# Patient Record
Sex: Female | Born: 1990 | Race: Black or African American | Hispanic: Yes | Marital: Single | Smoking: Former smoker
Health system: Southern US, Community
[De-identification: ages and names within clinical notes are randomized; demographics above are authoritative.]

## PROBLEM LIST (undated history)

## (undated) ENCOUNTER — Emergency Department (HOSPITAL_COMMUNITY): Payer: Medicaid Other

## (undated) DIAGNOSIS — J45909 Unspecified asthma, uncomplicated: Secondary | ICD-10-CM

---

## 2016-08-04 ENCOUNTER — Emergency Department (HOSPITAL_COMMUNITY)
Admission: EM | Admit: 2016-08-04 | Discharge: 2016-08-04 | Disposition: A | Discharge status: Home / Self Care | Payer: Medicaid - Out of State | Attending: Emergency Medicine | Admitting: Emergency Medicine

## 2016-08-04 ENCOUNTER — Encounter (HOSPITAL_COMMUNITY): Payer: Self-pay | Admitting: Emergency Medicine

## 2016-08-04 DIAGNOSIS — J45909 Unspecified asthma, uncomplicated: Secondary | ICD-10-CM | POA: Insufficient documentation

## 2016-08-04 DIAGNOSIS — O26892 Other specified pregnancy related conditions, second trimester: Secondary | ICD-10-CM | POA: Insufficient documentation

## 2016-08-04 DIAGNOSIS — H9201 Otalgia, right ear: Secondary | ICD-10-CM

## 2016-08-04 DIAGNOSIS — H6501 Acute serous otitis media, right ear: Secondary | ICD-10-CM | POA: Insufficient documentation

## 2016-08-04 DIAGNOSIS — Z3A23 23 weeks gestation of pregnancy: Secondary | ICD-10-CM | POA: Insufficient documentation

## 2016-08-04 HISTORY — DX: Unspecified asthma, uncomplicated: J45.909

## 2016-08-04 MED ORDER — AMOXICILLIN 500 MG PO CAPS: 500.0000 mg | ORAL_CAPSULE | Freq: Three times a day (TID) | ORAL | 0 refills | Status: DC

## 2016-08-04 NOTE — Discharge Instructions (Signed)
Take tylenol as needed for pain.  Call [facility] to schedule your prenatal care.  If you have problems go to their emergency department. It is called Maternity Admissions.
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
## 2016-08-04 NOTE — ED Provider Notes (Signed)
[facility]-EMERGENCY DEPT Provider Note   CSN: [id number] Arrival date & time: 08/04/16  1720   By signing my name below, I, Clovis Pu, attest that this documentation has been prepared under the direction and in the presence of  Kerrie Buffalo, NP. Electronically Signed: Clovis Pu, ED Scribe. 08/04/16. 6:16 PM.   History   Chief Complaint Chief Complaint  Patient presents with  . Otalgia    HPI Comments:  Yvonne Jenkins is a 26 y.o. female, [redacted]w[redacted]d, who presents to the Emergency Department complaining of ongoing, moderate right ear pain x 2 weeks. She also reports right sided dental pain. Her pain is worse when eating. Pt has been taking Tylenol with temporary relief. Pt denies abdominal pain or any other associated symptoms. Pt recently moved from [place] and not followed by OBGYN in [place], [place]. No other complaints noted at this time.   The history is provided by the patient. No language interpreter was used.  Otalgia  This is a new problem. The current episode started more than 1 week ago. There is pain in the right ear. The problem occurs constantly. The problem has not changed since onset.There has been no fever. The pain is moderate. Pertinent negatives include no ear discharge, no headaches, no sore throat, no abdominal pain, no vomiting and no rash.    Past Medical History:  Diagnosis Date  . Asthma     There are no active problems to display for this patient.   History reviewed. No pertinent surgical history.  OB History    Gravida Para Term Preterm AB Living   1             SAB TAB Ectopic Multiple Live Births                   Home Medications    Prior to Admission medications   Medication Sig Start Date End Date Taking? Authorizing Provider  amoxicillin (AMOXIL) 500 MG capsule Take 1 capsule (500 mg total) by mouth 3 (three) times daily. 08/04/16   Jayden Rudge Orlene Och, NP    Family History No family history on file.  Social History Social History  Substance Use  Topics  . Smoking status: Never Smoker  . Smokeless tobacco: Never Used  . Alcohol use No     Allergies   Patient has no allergy information on record.   Review of Systems Review of Systems  Constitutional: Negative for fever.  HENT: Positive for dental problem and ear pain. Negative for ear discharge and sore throat.   Respiratory: Negative for shortness of breath.   Gastrointestinal: Negative for abdominal pain, nausea and vomiting.  Genitourinary: Negative for dysuria.  Musculoskeletal: Negative for back pain.  Skin: Negative for rash.  Neurological: Negative for headaches.  Psychiatric/Behavioral: Negative for confusion.   Physical Exam Updated Vital Signs BP 124/77   Pulse (!) 105   Temp 98.4 F (36.9 C) (Oral)   Resp 16   Ht  (1.727 m)   Wt 74.8 kg   LMP 03/06/2016   SpO2 99%   BMI 25.09 kg/m   Physical Exam  Constitutional: She is oriented to person, place, and time. She appears well-developed and well-nourished. No distress.  HENT:  Head: Normocephalic and atraumatic.  Right Ear: No mastoid tenderness. Tympanic membrane is erythematous.  Left Ear: Tympanic membrane normal.  Mouth/Throat: Uvula is midline, oropharynx is clear and moist and mucous membranes are normal. No trismus in the jaw. No posterior oropharyngeal edema or  posterior oropharyngeal erythema.  No TMJ click or tenderness  Eyes: Conjunctivae and EOM are normal. Pupils are equal, round, and reactive to light.  Neck: Neck supple.  Cardiovascular: Normal rate and regular rhythm.   Pulmonary/Chest: Effort normal and breath sounds normal. She has no wheezes.  Abdominal:  Gravid consistent with dates. Positive doppler FHT's.  Musculoskeletal: Normal range of motion.  Lymphadenopathy:    She has cervical adenopathy (right).  Neurological: She is alert and oriented to person, place, and time.  Skin: Skin is warm and dry.  Psychiatric: She has a normal mood and affect.  Nursing note and  vitals reviewed.   ED Treatments / Results  DIAGNOSTIC STUDIES:  Oxygen Saturation is 99% on RA, normal by my interpretation.    COORDINATION OF CARE:  6:11 PM Discussed treatment plan with pt at bedside and pt agreed to plan.  Labs (all labs ordered are listed, but only abnormal results are displayed) Labs Reviewed - No data to display   Radiology No results found.  Procedures Procedures (including critical care time)  Medications Ordered in ED Medications - No data to display   Initial Impression / Assessment and Plan / ED Course  I have reviewed the triage vital signs and the nursing notes. Patient presents with otalgia. No concern for acute mastoiditis or meningitis.  Patient discharged home with amoxicillin.  I have also discussed reasons to return immediately to the ER.  Patient expresses understanding and agrees with plan. Pt appears safe for discharge.   Final Clinical Impressions(s) / ED Diagnoses   Final diagnoses:  Right ear pain  Right acute serous otitis media, recurrence not specified    New Prescriptions Discharge Medication List as of 08/04/2016  6:20 PM    START taking these medications   Details  amoxicillin (AMOXIL) 500 MG capsule Take 1 capsule (500 mg total) by mouth 3 (three) times daily., Starting Wed 08/04/2016, Print      I personally performed the services described in this documentation, which was scribed in my presence. The recorded information has been reviewed and is accurate.     [place], [place] 08/06/16 1734    Alvira Monday, MD 08/13/16 1146

## 2016-08-04 NOTE — ED Triage Notes (Signed)
Pt st's she has had pain in her right ear for several days  Has taken Tylenol with relief but then pain returns.  Pt is 6 months preg,

## 2017-03-12 ENCOUNTER — Emergency Department (HOSPITAL_COMMUNITY)
Admission: EM | Admit: 2017-03-12 | Discharge: 2017-03-12 | Disposition: A | Discharge status: Home / Self Care | Payer: Medicaid Other | Attending: Emergency Medicine | Admitting: Emergency Medicine

## 2017-03-12 ENCOUNTER — Encounter (HOSPITAL_COMMUNITY): Payer: Self-pay

## 2017-03-12 ENCOUNTER — Other Ambulatory Visit: Payer: Self-pay

## 2017-03-12 DIAGNOSIS — K029 Dental caries, unspecified: Secondary | ICD-10-CM | POA: Insufficient documentation

## 2017-03-12 DIAGNOSIS — J45909 Unspecified asthma, uncomplicated: Secondary | ICD-10-CM | POA: Insufficient documentation

## 2017-03-12 DIAGNOSIS — K0889 Other specified disorders of teeth and supporting structures: Secondary | ICD-10-CM | POA: Diagnosis not present

## 2017-03-12 MED ORDER — AMOXICILLIN 500 MG PO CAPS: 500.0000 mg | ORAL_CAPSULE | Freq: Two times a day (BID) | ORAL | 0 refills | Status: AC

## 2017-03-12 NOTE — ED Provider Notes (Signed)
[facility]   CSN: [id number] Arrival date & time: 03/12/17  [id number]     History   Chief Complaint Chief Complaint  Patient presents with  . Dental Pain    HPI Yvonne Jenkins is a 26 y.o. female who presents with dental pain.  Patient reports that she has had ongoing dental issues for the month but states over the last few days, dental pain has worsened.  She reports that the pain is located in the upper left tooth, where she has a partially cracked tooth.  Patient states that she does not follow-up with a dentist.  Patient states that she has been taking Tylenol with temporary improvement in pain.  Patient has been able to eat and drink without any difficulty.  Patient reports some mild facial swelling to the left side.  She states that the pain radiates up into her ear.  Patient denies any fevers, difficulty swallowing, difficulty breathing.  The history is provided by the patient.    Past Medical History:  Diagnosis Date  . Asthma     There are no active problems to display for this patient.   History reviewed. No pertinent surgical history.  OB History    Gravida Para Term Preterm AB Living   1             SAB TAB Ectopic Multiple Live Births                   Home Medications    Prior to Admission medications   Medication Sig Start Date End Date Taking? Authorizing Provider  ibuprofen (ADVIL,MOTRIN) 200 MG tablet Take 400-800 mg by mouth every 6 (six) hours as needed.   Yes [provider]  amoxicillin (AMOXIL) 500 MG capsule Take 1 capsule (500 mg total) by mouth 2 (two) times daily for 7 days. 03/12/17 03/19/17  Maxwell CaulLayden, Lindsey A, PA-C    Family History History reviewed. No pertinent family history.  Social History Social History   Tobacco Use  . Smoking status: Never Smoker  . Smokeless tobacco: Never Used  Substance Use Topics  . Alcohol use: No  . Drug use: No     Allergies   Patient has  no known allergies.   Review of Systems Review of Systems  Constitutional: Negative for fever.  HENT: Positive for dental problem and facial swelling. Negative for drooling and trouble swallowing.      Physical Exam Updated Vital Signs BP (!) 147/95 (BP Location: Right Arm)   Pulse 92   Temp 97.7 F (36.5 C) (Oral)   Resp 18   LMP 03/09/2017 (Within Days)   SpO2 99%   Breastfeeding? Unknown   Physical Exam  Constitutional: She appears well-developed and well-nourished.  HENT:  Head: Normocephalic and atraumatic.  Mouth/Throat: Oropharynx is clear and moist and mucous membranes are normal. No trismus in the jaw. Abnormal dentition. Dental caries present. No uvula swelling.    Multiple missing teeth.  Diffusely scattered dental caries noted throughout.  No identifiable dental abscess.  No fluctuance or mass.  No facial or neck swelling.  Uvula is midline.  No trismus.  Airways patent.  Phonation is intact  Eyes: Conjunctivae and EOM are normal. Right eye exhibits no discharge. Left eye exhibits no discharge. No scleral icterus.  Small pupils bilaterally  Pulmonary/Chest: Effort normal.  No evidence of respiratory distress. Able to speak in full sentences without difficulty.  Neurological: She is alert.  Skin: Skin  is warm and dry.  Psychiatric: She has a normal mood and affect. Her speech is normal and behavior is normal.  Nursing note and vitals reviewed.    ED Treatments / Results  Labs (all labs ordered are listed, but only abnormal results are displayed) Labs Reviewed - No data to display  EKG  EKG Interpretation None       Radiology No results found.  Procedures Procedures (including critical care time)  Medications Ordered in ED Medications - No data to display   Initial Impression / Assessment and Plan / ED Course  I have reviewed the triage vital signs and the nursing notes.  Pertinent labs & imaging results that were available during my care of  the patient were reviewed by me and considered in my medical decision making (see chart for details).     26 yo F presents with dental pain. Patient is afebrile, non-toxic appearing, sitting comfortably on examination table. Patient is afebrile, non-toxic appearing, sitting comfortably on examination table. Vital signs reviewed and stable. No evidence of abscess requiring immediate incision and drainage. Exam not concerning for Ludwig's angina or pharyngeal abscess. Will treat with antibiotics and NSAIDs. Patient with no known drug allergies. Patient instructed to follow-up with dentist referral provided. Stable for discharge at this time. Strict return precautions discussed. Patient expresses understanding and agreement to plan.   Final Clinical Impressions(s) / ED Diagnoses   Final diagnoses:  Pain, dental    ED Discharge Orders        Ordered    amoxicillin (AMOXIL) 500 MG capsule  2 times daily     03/12/17 0922       Maxwell CaulLayden, Lindsey A, PA-C 03/12/17 1100    Margarita Grizzleay, Danielle, MD 03/12/17 [id number]

## 2017-03-12 NOTE — ED Notes (Signed)
Declined W/C at D/C and was escorted to lobby by RN. 

## 2017-03-12 NOTE — Discharge Instructions (Signed)
Take the medications as directed.   You can take Tylenol or Ibuprofen as directed for pain. You can alternate Tylenol and Ibuprofen every 4 hours. If you take Tylenol at 1pm, then you can take Ibuprofen at 5pm. Then you can take Tylenol again at 9pm.   You can apply warm compresses to the area.   The exam and treatment you received today has been provided on an emergency basis only. This is not a substitute for complete medical or dental care. If your problem worsens or new symptoms (problems) appear, and you are unable to arrange prompt follow-up care with your dentist, call or return to this location. If you do not have a dentist, please follow-up with one on the list provided  CALL YOUR DENTIST OR RETURN IMMEDIATELY IF you develop a fever, rash, difficulty breathing or swallowing, neck or facial swelling, or other potentially serious concerns.

## 2017-03-12 NOTE — ED Triage Notes (Signed)
Pt states she has a top left tooth that is cracked. She states the pain is so bad she has been unable to sleep. She reports pain into her ear as well. No facial swelling noted.

## 2017-03-18 ENCOUNTER — Other Ambulatory Visit: Payer: Self-pay

## 2017-03-18 ENCOUNTER — Encounter (HOSPITAL_COMMUNITY): Payer: Self-pay

## 2017-03-18 DIAGNOSIS — J45909 Unspecified asthma, uncomplicated: Secondary | ICD-10-CM | POA: Diagnosis not present

## 2017-03-18 DIAGNOSIS — Y939 Activity, unspecified: Secondary | ICD-10-CM | POA: Diagnosis not present

## 2017-03-18 DIAGNOSIS — T25021A Burn of unspecified degree of right foot, initial encounter: Secondary | ICD-10-CM | POA: Diagnosis present

## 2017-03-18 DIAGNOSIS — T25121A Burn of first degree of right foot, initial encounter: Secondary | ICD-10-CM | POA: Insufficient documentation

## 2017-03-18 DIAGNOSIS — X118XXA Contact with other hot tap-water, initial encounter: Secondary | ICD-10-CM | POA: Insufficient documentation

## 2017-03-18 DIAGNOSIS — Y999 Unspecified external cause status: Secondary | ICD-10-CM | POA: Insufficient documentation

## 2017-03-18 DIAGNOSIS — Y929 Unspecified place or not applicable: Secondary | ICD-10-CM | POA: Diagnosis not present
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
## 2017-03-19 ENCOUNTER — Emergency Department (HOSPITAL_COMMUNITY)
Admission: EM | Admit: 2017-03-19 | Discharge: 2017-03-19 | Disposition: A | Discharge status: Home / Self Care | Payer: Medicaid Other | Attending: Emergency Medicine | Admitting: Emergency Medicine

## 2017-03-19 DIAGNOSIS — T25121A Burn of first degree of right foot, initial encounter: Secondary | ICD-10-CM

## 2017-03-19 MED ORDER — IBUPROFEN 600 MG PO TABS
600.0000 mg | ORAL_TABLET | Freq: Four times a day (QID) | ORAL | 0 refills | Status: DC | PRN
Start: 2017-03-19 — End: 2017-10-03
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
## 2017-03-29 ENCOUNTER — Encounter (HOSPITAL_COMMUNITY): Payer: Self-pay | Admitting: Emergency Medicine

## 2017-03-29 ENCOUNTER — Emergency Department (HOSPITAL_COMMUNITY)
Admission: EM | Admit: 2017-03-29 | Discharge: 2017-03-29 | Disposition: A | Discharge status: Home / Self Care | Payer: Medicaid Other | Attending: Emergency Medicine | Admitting: Emergency Medicine

## 2017-03-29 ENCOUNTER — Other Ambulatory Visit: Payer: Self-pay

## 2017-03-29 DIAGNOSIS — Z5321 Procedure and treatment not carried out due to patient leaving prior to being seen by health care provider: Secondary | ICD-10-CM | POA: Diagnosis not present

## 2017-03-29 DIAGNOSIS — K0889 Other specified disorders of teeth and supporting structures: Secondary | ICD-10-CM | POA: Insufficient documentation

## 2017-03-29 NOTE — ED Notes (Signed)
Patient up to desk while this RN was assisting a patient in a wheelchair.  Patient asked registration to cut her wristband off because "this wait sh*t is ridiculous".  Did not want to wait to discuss wait with this RN.  Will d/c.

## 2017-03-29 NOTE — ED Triage Notes (Signed)
Pt reports left sided upper tooth pain.  Reports it cracked about 30 minutes ago and is causing her a 7 out of 10 pain.

## 2017-10-03 ENCOUNTER — Encounter (HOSPITAL_COMMUNITY): Payer: Self-pay | Admitting: Emergency Medicine

## 2017-10-03 ENCOUNTER — Other Ambulatory Visit: Payer: Self-pay

## 2017-10-03 ENCOUNTER — Emergency Department (HOSPITAL_COMMUNITY)
Admission: EM | Admit: 2017-10-03 | Discharge: 2017-10-03 | Disposition: A | Discharge status: Home / Self Care | Payer: Medicaid Other | Attending: Emergency Medicine | Admitting: Emergency Medicine

## 2017-10-03 DIAGNOSIS — Z760 Encounter for issue of repeat prescription: Secondary | ICD-10-CM

## 2017-10-03 DIAGNOSIS — J45998 Other asthma: Secondary | ICD-10-CM | POA: Diagnosis not present

## 2017-10-03 DIAGNOSIS — Z79899 Other long term (current) drug therapy: Secondary | ICD-10-CM | POA: Diagnosis not present

## 2017-10-03 DIAGNOSIS — F17210 Nicotine dependence, cigarettes, uncomplicated: Secondary | ICD-10-CM | POA: Insufficient documentation

## 2017-10-03 DIAGNOSIS — K089 Disorder of teeth and supporting structures, unspecified: Secondary | ICD-10-CM | POA: Insufficient documentation

## 2017-10-03 DIAGNOSIS — K0889 Other specified disorders of teeth and supporting structures: Secondary | ICD-10-CM

## 2017-10-03 MED ORDER — VALACYCLOVIR HCL 500 MG PO TABS: 500.0000 mg | ORAL_TABLET | Freq: Two times a day (BID) | ORAL | 0 refills | Status: AC

## 2017-10-03 MED ORDER — PENICILLIN V POTASSIUM 500 MG PO TABS: 500.0000 mg | ORAL_TABLET | Freq: Four times a day (QID) | ORAL | 0 refills | Status: AC

## 2017-10-03 MED ORDER — IBUPROFEN 600 MG PO TABS: 600.0000 mg | ORAL_TABLET | Freq: Four times a day (QID) | ORAL | 0 refills | Status: DC | PRN

## 2017-10-03 NOTE — ED Triage Notes (Signed)
Pt complains of left upper molar pain that has been going on for several years. Denies swelling to site.

## 2017-10-03 NOTE — Discharge Instructions (Signed)
Please take entire course of antibiotics as directed to help with dental infection.  You can take Tylenol and ibuprofen for pain you can also use over-the-counter Orajel on a Q-tip to help with pain locally.  You will need to follow-up with a dentist for further management.  Return if you have significantly worsened pain, difficulty swallowing or breathing, pain or swelling under your tongue or any other new or concerning symptoms.  I provided a refill of your Valtrex, please follow-up with [facility] or the health department for further refills of this medication as needed.

## 2017-10-03 NOTE — ED Provider Notes (Signed)
[facility] EMERGENCY DEPARTMENT Provider Note   CSN: [id number] Arrival date & time: 10/03/17  1523     History   Chief Complaint Chief Complaint  Patient presents with  . Dental Pain    HPI Yvonne Jenkins is a 27 y.o. female.  Yvonne Jenkins is a 27 y.o. Female with history of asthma and genital herpes, presents to the emergency department for evaluation of pain to the left upper posterior molar.  She reports this tooth has been broken for several years and has caused intermittent pain but over the past 2 weeks pain has been getting increasingly worse she is noticed some swelling surrounding the tooth.  She has been taking Tylenol extra strength with minimal improvement in pain, has not tried anything else to treat this pain.  She denies any pain or swelling under the tongue, no facial swelling or pain around the eye.  No difficulty breathing or swallowing.  No fevers or chills, no nausea or vomiting.  Patient just recently moved here from [place] and does not have a local dentist.  Patient also requesting a refill of her Valtrex as she is currently having an outbreak of her genital herpes.     Past Medical History:  Diagnosis Date  . Asthma     There are no active problems to display for this patient.   History reviewed. No pertinent surgical history.   OB History    Gravida  1   Para      Term      Preterm      AB      Living        SAB      TAB      Ectopic      Multiple      Live Births               Home Medications    Prior to Admission medications   Medication Sig Start Date End Date Taking? Authorizing Provider  ibuprofen (ADVIL,MOTRIN) 600 MG tablet Take 1 tablet (600 mg total) by mouth every 6 (six) hours as needed. 03/19/17   Elpidio Anis, PA-C    Family History History reviewed. No pertinent family history.  Social History Social History   Tobacco Use  . Smoking status: Current Every Day Smoker    Types:  Cigarettes  . Smokeless tobacco: Never Used  Substance Use Topics  . Alcohol use: No  . Drug use: No     Allergies   Patient has no known allergies.   Review of Systems Review of Systems  Constitutional: Negative for chills and fever.  HENT: Positive for dental problem. Negative for drooling, facial swelling and trouble swallowing.   Respiratory: Negative for stridor.   Gastrointestinal: Negative for nausea and vomiting.  Genitourinary: Positive for genital sores.  Musculoskeletal: Negative for neck pain and neck stiffness.  Skin: Negative for color change and rash.     Physical Exam Updated Vital Signs BP 136/82 (BP Location: Right Arm)   Pulse 80   Temp 97.8 F (36.6 C) (Oral)   Resp 16   LMP 09/28/2017   SpO2 100%   Physical Exam  Constitutional: She appears well-developed and well-nourished. No distress.  HENT:  Head: Normocephalic and atraumatic.  Mouth/Throat: Oropharynx is clear and moist.  Left posterior upper molar broken and appears to be decaying, some surrounding erythema of the gums, no obvious drainable abscess, posterior oropharynx is clear and moist, no sublingual pain or  swelling, no appreciable facial swelling, no tenderness or pain over the neck, no trismus or torticollis  Eyes: Right eye exhibits no discharge. Left eye exhibits no discharge.  Neck: Normal range of motion. Neck supple.  Pulmonary/Chest: Effort normal. No respiratory distress.  Genitourinary:  Genitourinary Comments: Deferred by patient  Neurological: She is alert. Coordination normal.  Skin: Skin is warm and dry. Capillary refill takes less than 2 seconds. She is not diaphoretic.  Psychiatric: She has a normal mood and affect. Her behavior is normal.  Nursing note and vitals reviewed.    ED Treatments / Results  Labs (all labs ordered are listed, but only abnormal results are displayed) Labs Reviewed - No data to display  EKG None  Radiology No results  found.  Procedures Procedures (including critical care time)  Medications Ordered in ED Medications - No data to display   Initial Impression / Assessment and Plan / ED Course  I have reviewed the triage vital signs and the nursing notes.  Pertinent labs & imaging results that were available during my care of the patient were reviewed by me and considered in my medical decision making (see chart for details).  Patient with toothache.  No gross abscess.  Exam unconcerning for Ludwig's angina or spread of infection.  Will treat with penicillin and anti-inflammatories medicine.  Urged patient to follow-up with dentist, dental resources provided.  Pain controlled here in the ED with topical anesthetic.  Patient requesting refill of Valtrex, currently having an outbreak of her genital herpes and recently moved here and has not established primary care yet, refill provided.  Final Clinical Impressions(s) / ED Diagnoses   Final diagnoses:  Toothache  Medication refill    ED Discharge Orders        Ordered    penicillin v potassium (VEETID) 500 MG tablet  4 times daily     10/03/17 1844    valACYclovir (VALTREX) 500 MG tablet  2 times daily     10/03/17 1844    ibuprofen (ADVIL,MOTRIN) 600 MG tablet  Every 6 hours PRN     10/03/17 1844       Dartha LodgeFord, Gerldine Suleiman N, PA-C 10/03/17 1857    Jacalyn LefevreHaviland, Julie, MD 10/03/17 2309

## 2017-11-15 ENCOUNTER — Encounter (HOSPITAL_COMMUNITY): Payer: Self-pay

## 2017-11-15 ENCOUNTER — Emergency Department (HOSPITAL_COMMUNITY)
Admission: EM | Admit: 2017-11-15 | Discharge: 2017-11-15 | Disposition: A | Discharge status: Home / Self Care | Payer: Medicaid Other | Attending: Emergency Medicine | Admitting: Emergency Medicine

## 2017-11-15 DIAGNOSIS — R05 Cough: Secondary | ICD-10-CM | POA: Diagnosis present

## 2017-11-15 DIAGNOSIS — J4521 Mild intermittent asthma with (acute) exacerbation: Secondary | ICD-10-CM | POA: Diagnosis not present

## 2017-11-15 DIAGNOSIS — J069 Acute upper respiratory infection, unspecified: Secondary | ICD-10-CM | POA: Diagnosis not present

## 2017-11-15 DIAGNOSIS — F17210 Nicotine dependence, cigarettes, uncomplicated: Secondary | ICD-10-CM | POA: Insufficient documentation

## 2017-11-15 DIAGNOSIS — B9789 Other viral agents as the cause of diseases classified elsewhere: Secondary | ICD-10-CM | POA: Insufficient documentation

## 2017-11-15 MED ORDER — BENZONATATE 200 MG PO CAPS: 200.0000 mg | ORAL_CAPSULE | Freq: Three times a day (TID) | ORAL | 0 refills | Status: AC

## 2017-11-15 MED ORDER — PREDNISONE 20 MG PO TABS
60.0000 mg | ORAL_TABLET | Freq: Once | ORAL | Status: AC
  Administered 2017-11-15: 60 mg via ORAL
  Filled 2017-11-15: qty 3

## 2017-11-15 MED ORDER — ALBUTEROL SULFATE (2.5 MG/3ML) 0.083% IN NEBU
2.5000 mg | INHALATION_SOLUTION | Freq: Once | RESPIRATORY_TRACT | Status: AC
  Administered 2017-11-15: 2.5 mg via RESPIRATORY_TRACT
  Filled 2017-11-15: qty 3

## 2017-11-15 MED ORDER — PREDNISONE 10 MG PO TABS: 40.0000 mg | ORAL_TABLET | Freq: Every day | ORAL | 0 refills | Status: AC

## 2017-11-15 MED ORDER — ALBUTEROL SULFATE HFA 108 (90 BASE) MCG/ACT IN AERS: 1.0000 | INHALATION_SPRAY | Freq: Four times a day (QID) | RESPIRATORY_TRACT | 2 refills | Status: DC | PRN

## 2017-11-15 NOTE — ED Provider Notes (Signed)
[facility] Provider [facility]   CSN: [id number] Arrival date & time: 11/15/17  1800     History   Chief Complaint Chief Complaint  Patient presents with  . Cough    HPI Yvonne Jenkins is a 27 y.o. female.  27yo female presents with complaint to non productive cough, wheezing, chest tightness x 1 week, no relief with her child's albuterol nebulizer. Exposed to her 27yo who has had a cold recently. Denies fevers, chills, difficulty breathing. Current smoker. No other complaints or concerns.      Past Medical History:  Diagnosis Date  . Asthma     There are no active problems to display for this patient.   History reviewed. No pertinent surgical history.   OB History    Gravida  1   Para      Term      Preterm      AB      Living        SAB      TAB      Ectopic      Multiple      Live Births               Home Medications    Prior to Admission medications   Medication Sig Start Date End Date Taking? Authorizing Provider  albuterol (PROVENTIL HFA;VENTOLIN HFA) 108 (90 Base) MCG/ACT inhaler Inhale 1-2 puffs into the lungs every 6 (six) hours as needed for wheezing or shortness of breath. 11/15/17   Jeannie FendMurphy, Harrol Novello A, PA-C  benzonatate (TESSALON) 200 MG capsule Take 1 capsule (200 mg total) by mouth every 8 (eight) hours for 10 days. 11/15/17 11/25/17  Jeannie FendMurphy, Serene Kopf A, PA-C  ibuprofen (ADVIL,MOTRIN) 600 MG tablet Take 1 tablet (600 mg total) by mouth every 6 (six) hours as needed. 10/03/17   Dartha LodgeFord, Kelsey N, PA-C  predniSONE (DELTASONE) 10 MG tablet Take 4 tablets (40 mg total) by mouth daily for 4 days. 11/15/17 11/19/17  Jeannie FendMurphy, Mazey Mantell A, PA-C    Family History History reviewed. No pertinent family history.  Social History Social History   Tobacco Use  . Smoking status: Current Every Day Smoker    Types: Cigarettes  . Smokeless tobacco: Never Used  Substance Use Topics  . Alcohol use: No  . Drug use: No      Allergies   Patient has no known allergies.   Review of Systems Review of Systems  Constitutional: Negative for chills and fever.  HENT: Positive for sore throat. Negative for congestion, rhinorrhea, sinus pressure, sinus pain and sneezing.   Eyes: Negative for discharge and redness.  Respiratory: Positive for cough, shortness of breath and wheezing.   Cardiovascular: Negative for chest pain.  Musculoskeletal: Negative for arthralgias and myalgias.  Skin: Negative for rash and wound.  Allergic/Immunologic: Negative for immunocompromised state.  Neurological: Negative for weakness and headaches.  Hematological: Does not bruise/bleed easily.  Psychiatric/Behavioral: Negative for confusion.  All other systems reviewed and are negative.    Physical Exam Updated Vital Signs BP (!) 139/92 (BP Location: Right Arm)   Pulse 88   Temp 98.3 F (36.8 C) (Oral)   Resp 18   Ht 5\' 8"  (1.727 m)   Wt 85.7 kg (189 lb)   LMP 11/14/2017   SpO2 100%   BMI 28.74 kg/m   Physical Exam  Constitutional: She is oriented to person, place, and time. She appears well-developed and well-nourished. No distress.  HENT:  Head: Normocephalic  and atraumatic.  Right Ear: External ear normal.  Left Ear: External ear normal.  Nose: Nose normal.  Mouth/Throat: Oropharynx is clear and moist. No oropharyngeal exudate.  Eyes: Conjunctivae are normal.  Cardiovascular: Normal rate and regular rhythm.  Pulmonary/Chest: Effort normal and breath sounds normal.  Lymphadenopathy:    She has no cervical adenopathy.  Neurological: She is alert and oriented to person, place, and time.  Skin: Skin is warm and dry. No rash noted. She is not diaphoretic.  Psychiatric: She has a normal mood and affect. Her behavior is normal.  Nursing note and vitals reviewed.    ED Treatments / Results  Labs (all labs ordered are listed, but only abnormal results are displayed) Labs Reviewed - No data to  display  EKG None  Radiology No results found.  Procedures Procedures (including critical care time)  Medications Ordered in ED Medications  albuterol (PROVENTIL) (2.5 MG/3ML) 0.083% nebulizer solution 2.5 mg (2.5 mg Nebulization Given 11/15/17 1908)  predniSONE (DELTASONE) tablet 60 mg (60 mg Oral Given 11/15/17 1908)     Initial Impression / Assessment and Plan / ED Course  I have reviewed the triage vital signs and the nursing notes.  Pertinent labs & imaging results that were available during my care of the patient were reviewed by me and considered in my medical decision making (see chart for details).  Clinical Course as of Nov 16 1947  Tue Nov 15, 2017  1950 26yo female, hx of asthma, daily smoker, cough/wheezing/chest tightness x 1 week. Exam is unremarkable, patient was given albuterol neb x 1 with prednisone 60mg . Reports respiratory symptoms have resolved. Dc home with rx for albuterol inhaler with refills, tessalon, prednisone for 4 additional days. Also recommend daily flonase and tessalon. Recheck with PCP, referral given, return to ER for worsening or concerning symptoms.    [LM]    Clinical Course User Index [LM] Jeannie Fend, PA-C    Final Clinical Impressions(s) / ED Diagnoses   Final diagnoses:  Viral URI with cough  Mild intermittent asthma with exacerbation    ED Discharge Orders        Ordered    predniSONE (DELTASONE) 10 MG tablet  Daily     11/15/17 1943    benzonatate (TESSALON) 200 MG capsule  Every 8 hours     11/15/17 1943    albuterol (PROVENTIL HFA;VENTOLIN HFA) 108 (90 Base) MCG/ACT inhaler  Every 6 hours PRN     11/15/17 1943       Jeannie Fend, PA-C 11/15/17 1949    Pricilla Loveless, MD 11/17/17 [phone]

## 2017-11-15 NOTE — Discharge Instructions (Addendum)
Take Prednisone as prescribed and complete the full course. Tessalon and inhaler as needed as prescribed. Zyrtec and Flonase daily- these help with colds and are asthma control medications.

## 2017-11-15 NOTE — ED Triage Notes (Signed)
Pt presents with 1 week h/o cough; reports using her kids albuterol which is not helping.

## 2017-11-15 NOTE — ED Notes (Signed)
Declined W/C at D/C and was escorted to lobby by RN. 

## 2018-02-05 ENCOUNTER — Encounter (HOSPITAL_COMMUNITY): Payer: Self-pay | Admitting: Emergency Medicine

## 2018-02-05 ENCOUNTER — Other Ambulatory Visit: Payer: Self-pay

## 2018-02-05 ENCOUNTER — Emergency Department (HOSPITAL_COMMUNITY)
Admission: EM | Admit: 2018-02-05 | Discharge: 2018-02-05 | Disposition: A | Discharge status: Home / Self Care | Payer: Medicaid Other | Attending: Emergency Medicine | Admitting: Emergency Medicine

## 2018-02-05 DIAGNOSIS — J4521 Mild intermittent asthma with (acute) exacerbation: Secondary | ICD-10-CM | POA: Diagnosis not present

## 2018-02-05 DIAGNOSIS — R0602 Shortness of breath: Secondary | ICD-10-CM | POA: Diagnosis present

## 2018-02-05 DIAGNOSIS — F17210 Nicotine dependence, cigarettes, uncomplicated: Secondary | ICD-10-CM | POA: Diagnosis not present

## 2018-02-05 DIAGNOSIS — B009 Herpesviral infection, unspecified: Secondary | ICD-10-CM | POA: Diagnosis not present

## 2018-02-05 MED ORDER — ALBUTEROL SULFATE HFA 108 (90 BASE) MCG/ACT IN AERS: 1.0000 | INHALATION_SPRAY | Freq: Four times a day (QID) | RESPIRATORY_TRACT | 0 refills | Status: DC | PRN

## 2018-02-05 MED ORDER — VALACYCLOVIR HCL 1 G PO TABS: 1000.0000 mg | ORAL_TABLET | Freq: Every day | ORAL | 0 refills | Status: AC

## 2018-02-05 MED ORDER — PREDNISONE 20 MG PO TABS: 60.0000 mg | ORAL_TABLET | Freq: Every day | ORAL | 0 refills | Status: AC

## 2018-02-05 MED ORDER — IPRATROPIUM-ALBUTEROL 0.5-2.5 (3) MG/3ML IN SOLN
3.0000 mL | Freq: Once | RESPIRATORY_TRACT | Status: AC
  Administered 2018-02-05: 3 mL via RESPIRATORY_TRACT
  Filled 2018-02-05: qty 3

## 2018-02-05 MED ORDER — PREDNISONE 20 MG PO TABS
60.0000 mg | ORAL_TABLET | Freq: Once | ORAL | Status: AC
  Administered 2018-02-05: 60 mg via ORAL
  Filled 2018-02-05: qty 3

## 2018-02-05 NOTE — ED Provider Notes (Signed)
[facility] EMERGENCY DEPARTMENT Provider Note   CSN: [id number] Arrival date & time: 02/05/18  0908     History   Chief Complaint Chief Complaint  Patient presents with  . Asthma    HPI Denai Caba is a 27 y.o. female medical history significant for asthma who presents for evaluation of asthma exacerbation.  Patient states she has had increased wheezing over the last 2 weeks.  Patient states she went to [place] to visit her mother and used her mother's "blue inhaler."  Patient states that the blue inhaler works, that the red one that was prescribed to her approximately 2 months ago for her last asthma exacerbation does not work.  Patient does not know the name of the "blue inhaler."  Denies fever, chills, nausea, vomiting, chest pain, abdominal pain, upper respiratory symptoms, body aches and pains.  Patient states she has had mild shortness of breath with exertion.  Denies sick contacts.  History obtained from patient.  No interpreter was used.  HPI  Past Medical History:  Diagnosis Date  . Asthma     There are no active problems to display for this patient.   History reviewed. No pertinent surgical history.   OB History    Gravida  1   Para      Term      Preterm      AB      Living        SAB      TAB      Ectopic      Multiple      Live Births               Home Medications    Prior to Admission medications   Medication Sig Start Date End Date Taking? Authorizing Provider  albuterol (PROVENTIL HFA;VENTOLIN HFA) 108 (90 Base) MCG/ACT inhaler Inhale 1-2 puffs into the lungs every 6 (six) hours as needed for wheezing or shortness of breath. 02/05/18   Raylynne Cubbage A, PA-C  ibuprofen (ADVIL,MOTRIN) 600 MG tablet Take 1 tablet (600 mg total) by mouth every 6 (six) hours as needed. 10/03/17   Dartha Lodge, PA-C  predniSONE (DELTASONE) 20 MG tablet Take 3 tablets (60 mg total) by mouth daily for 4 days. 02/05/18 02/09/18   Ercole Georg A, PA-C  valACYclovir (VALTREX) 1000 MG tablet Take 1 tablet (1,000 mg total) by mouth daily for 10 days. 02/05/18 02/15/18  Bethanie Bloxom A, PA-C    Family History No family history on file.  Social History Social History   Tobacco Use  . Smoking status: Current Every Day Smoker    Types: Cigarettes  . Smokeless tobacco: Never Used  Substance Use Topics  . Alcohol use: No  . Drug use: No     Allergies   Patient has no known allergies.   Review of Systems Review of Systems  Constitutional: Negative.   HENT: Negative.   Respiratory: Positive for wheezing. Negative for apnea, cough, choking, chest tightness, shortness of breath and stridor.   Cardiovascular: Negative.   Gastrointestinal: Negative.   Genitourinary: Negative.   Musculoskeletal: Negative.   Skin: Negative.   Neurological: Negative.   All other systems reviewed and are negative.    Physical Exam Updated Vital Signs BP 119/75 (BP Location: Right Arm)   Pulse 97   Temp 98 F (36.7 C) (Oral)   Resp 15   Ht 5' 7.5" (1.715 m)   Wt 81.6 kg   LMP  01/10/2018   SpO2 95%   BMI 27.78 kg/m   Physical Exam  Constitutional: Vital signs are normal. She appears well-developed and well-nourished.  Non-toxic appearance. She does not have a sickly appearance. She does not appear ill. No distress.  HENT:  Head: Atraumatic.  Right Ear: Tympanic membrane, external ear and ear canal normal.  Left Ear: Tympanic membrane, external ear and ear canal normal.  Nose: Nose normal. No mucosal edema, rhinorrhea, nose lacerations, sinus tenderness, nasal deformity, septal deviation or nasal septal hematoma. No epistaxis.  No foreign bodies. Right sinus exhibits no maxillary sinus tenderness and no frontal sinus tenderness. Left sinus exhibits no maxillary sinus tenderness and no frontal sinus tenderness.  Mouth/Throat: Uvula is midline, oropharynx is clear and moist and mucous membranes are normal. No oral  lesions. No trismus in the jaw. No uvula swelling. No oropharyngeal exudate, posterior oropharyngeal edema, posterior oropharyngeal erythema or tonsillar abscesses. No tonsillar exudate.  Eyes: Pupils are equal, round, and reactive to light.  Neck: Normal range of motion.  Cardiovascular: Normal rate, normal heart sounds, intact distal pulses and normal pulses.  Pulmonary/Chest: Effort normal. No accessory muscle usage or stridor. No tachypnea. No respiratory distress. She has no decreased breath sounds. She has wheezes. She has no rhonchi. She has no rales.  Bilateral mild diffuse expiratory wheezes.  Patient able to speak in full sentences without difficulty.  There is no accessory muscle usage.  She is not tachypneic.  Oxygen saturations 98% on room air.  Does not appear in any distress.  Abdominal: She exhibits no distension.  Genitourinary:    There is no rash, tenderness, lesion or injury on the right labia. There is tenderness and lesion on the left labia. There is no rash on the left labia.  Genitourinary Comments: Small, 3 mm rounded vesicle on erythematous base.  Consistent with herpes labialis.  GU exam with chaperone in room.  Musculoskeletal: Normal range of motion.  Lymphadenopathy: No inguinal adenopathy noted on the right or left side.  Neurological: She is alert.  Skin: Skin is warm and dry. She is not diaphoretic.  Psychiatric: She has a normal mood and affect.  Nursing note and vitals reviewed.    ED Treatments / Results  Labs (all labs ordered are listed, but only abnormal results are displayed) Labs Reviewed - No data to display  EKG None  Radiology No results found.  Procedures Procedures (including critical care time)  Medications Ordered in ED Medications  ipratropium-albuterol (DUONEB) 0.5-2.5 (3) MG/3ML nebulizer solution 3 mL (3 mLs Nebulization Given 02/05/18 1023)  predniSONE (DELTASONE) tablet 60 mg (60 mg Oral Given 02/05/18 1021)     Initial  Impression / Assessment and Plan / ED Course  I have reviewed the triage vital signs and the nursing notes.  Pertinent labs & imaging results that were available during my care of the patient were reviewed by me and considered in my medical decision making (see chart for details).  27 year old female who appears otherwise well presents for evaluation of asthma exacerbation.  Asthma has been intermittently worsening over the last 2 weeks.  States her albuterol inhaler at home has not been working.  States she used her mom's "blue inhaler "with relief of symptoms.  Patient states the blue inhaler is albuterol, Ventolin.  Discussed with patient that this is the same ingredients as her red inhaler.  Patient is adamant that this is a different inhaler.  Bilateral mild diffuse expiratory wheezes.  Able to speak in full  sentences without difficulty.  Oxygen saturations 98% on room air.  She does not appear in any distress.  Will give prednisone and DuoNeb and reevaluate.  On reevaluation patient without wheezing, will refill prescriptions.  Able to speak in full sentences without difficulty.  No signs of respiratory distress.  Patient on reevaluation would also like a refill of her Valtrex.  GU exam with small vesicle on an erythematous base, consistent with herpes labialis.  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  Will prescribe Valtrex.  Discussed with patient return precautions.  Patient voiced understanding is agreeable for follow-up.  Patient is stable for discharge at this time.     Final Clinical Impressions(s) / ED Diagnoses   Final diagnoses:  Mild intermittent asthma with exacerbation  Herpes    ED Discharge Orders         Ordered    predniSONE (DELTASONE) 20 MG tablet  Daily     02/05/18 1104    albuterol (PROVENTIL HFA;VENTOLIN HFA) 108 (90 Base) MCG/ACT inhaler  Every 6 hours PRN     02/05/18 1110    valACYclovir (VALTREX) 1000 MG tablet  Daily     02/05/18 1110           Jaiya Mooradian,  [place]ica Inthavong A, PA-C 02/05/18 1119    Melene Plan, DO 02/05/18 1239

## 2018-02-05 NOTE — ED Triage Notes (Signed)
Pt. Stated, Ive had asthma for 2 weeks, yall gave me the red one, I need the blue one?

## 2018-02-05 NOTE — Discharge Instructions (Signed)
Your evaluated today for an asthma exacerbation.  I have written you prescription for prednisone as well as the albuterol/Ventolin inhaler.  Please take this as prescribed.  I have also write a prescription for Valtrex for your herpes outbreak.  Please take as prescribed.  Follow-up with primary care provider if you have recurrent issues.  Return to ED for any new or worsening symptoms

## 2019-07-19 ENCOUNTER — Encounter (HOSPITAL_COMMUNITY): Payer: Self-pay | Admitting: *Deleted

## 2019-07-19 ENCOUNTER — Emergency Department (HOSPITAL_COMMUNITY)
Admission: EM | Admit: 2019-07-19 | Discharge: 2019-07-19 | Disposition: A | Discharge status: Home / Self Care | Payer: Medicaid Other | Attending: Emergency Medicine | Admitting: Emergency Medicine

## 2019-07-19 ENCOUNTER — Emergency Department (HOSPITAL_COMMUNITY): Payer: Medicaid Other

## 2019-07-19 DIAGNOSIS — J45909 Unspecified asthma, uncomplicated: Secondary | ICD-10-CM | POA: Diagnosis not present

## 2019-07-19 DIAGNOSIS — F17210 Nicotine dependence, cigarettes, uncomplicated: Secondary | ICD-10-CM | POA: Insufficient documentation

## 2019-07-19 DIAGNOSIS — R0600 Dyspnea, unspecified: Secondary | ICD-10-CM | POA: Diagnosis present

## 2019-07-19 MED ORDER — IPRATROPIUM-ALBUTEROL 0.5-2.5 (3) MG/3ML IN SOLN: 3.0000 mL | Freq: Once | RESPIRATORY_TRACT | Status: DC

## 2019-07-19 MED ORDER — AEROCHAMBER PLUS FLO-VU LARGE MISC
1.0000 | Freq: Once | Status: DC
  Filled 2019-07-19: qty 1

## 2019-07-19 MED ORDER — ALBUTEROL SULFATE HFA 108 (90 BASE) MCG/ACT IN AERS
2.0000 | INHALATION_SPRAY | RESPIRATORY_TRACT | Status: DC | PRN
  Administered 2019-07-19: 2 via RESPIRATORY_TRACT
  Filled 2019-07-19 (×2): qty 6.7

## 2019-07-19 MED ORDER — MONTELUKAST SODIUM 10 MG PO TABS: 10.0000 mg | ORAL_TABLET | Freq: Every day | ORAL | 0 refills | Status: DC

## 2019-07-19 MED ORDER — PREDNISONE 20 MG PO TABS
60.0000 mg | ORAL_TABLET | ORAL | Status: AC
  Administered 2019-07-19: 60 mg via ORAL
  Filled 2019-07-19: qty 3

## 2019-07-19 MED ORDER — PREDNISONE 50 MG PO TABS: 50.0000 mg | ORAL_TABLET | Freq: Every day | ORAL | 0 refills | Status: DC

## 2019-07-19 NOTE — ED Triage Notes (Signed)
To ED for eval of 'asthma'. States she has been having to get up every 2 hrs for a treatment during the night. States she has been using her kid's albuterol solution. Speaking in full complete sentences. Appears in nad.

## 2019-07-19 NOTE — ED Provider Notes (Signed)
[place] EMERGENCY DEPARTMENT Provider Note   CSN: [id number] Arrival date & time: 07/19/19  [place]     History Chief Complaint  Patient presents with  . Asthma    Yvonne Jenkins is a 29 y.o. female.  HPI Patient presents to the emergency department with feeling like she is having trouble with breathing especially since the allergens have started becoming worse.  The patient states that she does not have asthma constantly but this time of the year it seems to kick up and get worse.  The patient states that she used her home nebulizer for her child in which did seem to help some.  The patient states that she feels like the tightness continues with slight wheezing and cough.  Patient states that she did not have an inhaler at home.  The patient denies chest pain,  headache,blurred vision, neck pain, fever, cough, weakness, numbness, dizziness, anorexia, edema, abdominal pain, nausea, vomiting, diarrhea, rash, back pain, dysuria, hematemesis, bloody stool, near syncope, or syncope.    Past Medical History:  Diagnosis Date  . Asthma     There are no problems to display for this patient.   History reviewed. No pertinent surgical history.   OB History    Gravida  1   Para      Term      Preterm      AB      Living        SAB      TAB      Ectopic      Multiple      Live Births              No family history on file.  Social History   Tobacco Use  . Smoking status: Current Every Day Smoker    Types: Cigarettes  . Smokeless tobacco: Never Used  Substance Use Topics  . Alcohol use: No  . Drug use: No    Home Medications Prior to Admission medications   Medication Sig Start Date End Date Taking? Authorizing Provider  albuterol (PROVENTIL HFA;VENTOLIN HFA) 108 (90 Base) MCG/ACT inhaler Inhale 1-2 puffs into the lungs every 6 (six) hours as needed for wheezing or shortness of breath. 02/05/18   Henderly, Britni A, PA-C  ibuprofen  (ADVIL,MOTRIN) 600 MG tablet Take 1 tablet (600 mg total) by mouth every 6 (six) hours as needed. 10/03/17   Jacqlyn Larsen, PA-C    Allergies    Patient has no known allergies.  Review of Systems   Review of Systems All other systems negative except as documented in the HPI. All pertinent positives and negatives as reviewed in the HPI. Physical Exam Updated Vital Signs BP (!) 147/101   Pulse (!) 101   Temp 98.7 F (37.1 C) (Oral)   Resp 16   Ht 5' 7.5" (1.715 m)   Wt 77.1 kg   SpO2 100%   BMI 26.23 kg/m   Physical Exam Vitals and nursing note reviewed.  Constitutional:      General: She is not in acute distress.    Appearance: She is well-developed.  HENT:     Head: Normocephalic and atraumatic.  Eyes:     Pupils: Pupils are equal, round, and reactive to light.  Cardiovascular:     Rate and Rhythm: Normal rate and regular rhythm.     Heart sounds: Normal heart sounds. No murmur. No friction rub. No gallop.   Pulmonary:     Effort: Pulmonary  effort is normal. No respiratory distress.     Breath sounds: Normal breath sounds. No wheezing, rhonchi or rales.  Abdominal:     General: Bowel sounds are normal. There is no distension.     Palpations: Abdomen is soft.     Tenderness: There is no abdominal tenderness.  Musculoskeletal:     Cervical back: Normal range of motion and neck supple.  Skin:    General: Skin is warm and dry.     Capillary Refill: Capillary refill takes less than 2 seconds.     Findings: No erythema or rash.  Neurological:     Mental Status: She is alert and oriented to person, place, and time.     Motor: No abnormal muscle tone.     Coordination: Coordination normal.  Psychiatric:        Behavior: Behavior normal.     ED Results / Procedures / Treatments   Labs (all labs ordered are listed, but only abnormal results are displayed) Labs Reviewed - No data to display  EKG None  Radiology DG Chest 2 View  Result Date: 07/19/2019 CLINICAL  DATA:  Shortness of breath. EXAM: CHEST - 2 VIEW COMPARISON:  None. FINDINGS: The cardiomediastinal contours are normal. The lungs are clear. Pulmonary vasculature is normal. No consolidation, pleural effusion, or pneumothorax. No acute osseous abnormalities are seen. IMPRESSION: Negative radiographs of the chest. Electronically Signed   By: Narda Rutherford M.D.   On: 07/19/2019 19:17    Procedures Procedures (including critical care time)  Medications Ordered in ED Medications  predniSONE (DELTASONE) tablet 60 mg (has no administration in time range)  ipratropium-albuterol (DUONEB) 0.5-2.5 (3) MG/3ML nebulizer solution 3 mL (has no administration in time range)    ED Course  I have reviewed the triage vital signs and the nursing notes.  Pertinent labs & imaging results that were available during my care of the patient were reviewed by me and considered in my medical decision making (see chart for details).    MDM Rules/Calculators/A&P   She will be given prednisone along an inhaler.  The patient's lungs sound completely clear.  Patient is advised of the need to increase her fluid intake.  Have advised her to minimize her time outside as this seems to be an exacerbating factor.    Final Clinical Impression(s) / ED Diagnoses Final diagnoses:  None    Rx / DC Orders ED Discharge Orders    None       Kyra Manges 07/19/19 2130    Charlynne Pander, MD 07/19/19 2325

## 2019-07-19 NOTE — ED Notes (Signed)
Patient verbalizes understanding of discharge instructions. Opportunity for questioning and answers were provided. Armband removed by staff, pt discharged from ED ambulatory to home.  

## 2019-07-19 NOTE — Discharge Instructions (Addendum)
Return here as needed.  You will need to follow-up with your primary doctor as well.  We have given you medications to help combat the seasonal allergy effect on your breathing.  Your chest x-ray tonight did not show any abnormalities.

## 2019-11-06 ENCOUNTER — Emergency Department (HOSPITAL_COMMUNITY)
Admission: EM | Admit: 2019-11-06 | Discharge: 2019-11-06 | Disposition: A | Discharge status: Home / Self Care | Payer: Medicaid Other | Attending: Emergency Medicine | Admitting: Emergency Medicine

## 2019-11-06 ENCOUNTER — Other Ambulatory Visit: Payer: Self-pay

## 2019-11-06 ENCOUNTER — Encounter (HOSPITAL_COMMUNITY): Payer: Self-pay

## 2019-11-06 DIAGNOSIS — F17210 Nicotine dependence, cigarettes, uncomplicated: Secondary | ICD-10-CM | POA: Insufficient documentation

## 2019-11-06 DIAGNOSIS — Z7951 Long term (current) use of inhaled steroids: Secondary | ICD-10-CM | POA: Insufficient documentation

## 2019-11-06 DIAGNOSIS — J45909 Unspecified asthma, uncomplicated: Secondary | ICD-10-CM | POA: Insufficient documentation

## 2019-11-06 DIAGNOSIS — K029 Dental caries, unspecified: Secondary | ICD-10-CM | POA: Diagnosis not present

## 2019-11-06 DIAGNOSIS — K0889 Other specified disorders of teeth and supporting structures: Secondary | ICD-10-CM | POA: Diagnosis present

## 2019-11-06 MED ORDER — ACETAMINOPHEN 325 MG PO TABS
650.0000 mg | ORAL_TABLET | Freq: Once | ORAL | Status: AC
  Administered 2019-11-06: 15:00:00 650 mg via ORAL
  Filled 2019-11-06: qty 2

## 2019-11-06 MED ORDER — NAPROXEN 500 MG PO TABS: 500.0000 mg | ORAL_TABLET | Freq: Two times a day (BID) | ORAL | 0 refills | Status: DC

## 2019-11-06 MED ORDER — AMOXICILLIN 500 MG PO CAPS: 500.0000 mg | ORAL_CAPSULE | Freq: Two times a day (BID) | ORAL | 0 refills | Status: AC

## 2019-11-06 NOTE — Discharge Instructions (Addendum)

## 2019-11-06 NOTE — ED Provider Notes (Signed)
[facility] EMERGENCY DEPARTMENT Provider Note   CSN: [id number] Arrival date & time: 11/06/19  1216     History No chief complaint on file.   Yvonne Jenkins is a 29 y.o. female who presents emergency department with dental pain.  She complains of a broken right lower third molar which she broke 2 weeks ago.  Over the last week she has developed constant, throbbing, aching pain which is worse with pressure, heat and cold.  She denies any changes in voice, difficulty swallowing, fever or chills.  HPI     Past Medical History:  Diagnosis Date  . Asthma     There are no problems to display for this patient.   History reviewed. No pertinent surgical history.   OB History    Gravida  1   Para      Term      Preterm      AB      Living        SAB      TAB      Ectopic      Multiple      Live Births              No family history on file.  Social History   Tobacco Use  . Smoking status: Current Every Day Smoker    Types: Cigarettes  . Smokeless tobacco: Never Used  Substance Use Topics  . Alcohol use: No  . Drug use: No    Home Medications Prior to Admission medications   Medication Sig Start Date End Date Taking? Authorizing Provider  albuterol (PROVENTIL HFA;VENTOLIN HFA) 108 (90 Base) MCG/ACT inhaler Inhale 1-2 puffs into the lungs every 6 (six) hours as needed for wheezing or shortness of breath. 02/05/18   Henderly, Britni A, PA-C  ibuprofen (ADVIL,MOTRIN) 600 MG tablet Take 1 tablet (600 mg total) by mouth every 6 (six) hours as needed. Patient not taking: Reported on 07/19/2019 10/03/17   Dartha Lodge, PA-C  montelukast (SINGULAIR) 10 MG tablet Take 1 tablet (10 mg total) by mouth at bedtime. 07/19/19   Lawyer, Cristal Deer, PA-C  predniSONE (DELTASONE) 50 MG tablet Take 1 tablet (50 mg total) by mouth daily with breakfast. 07/19/19   Charlestine Night, PA-C    Allergies    Patient has no known allergies.  Review of  Systems   Review of Systems  Constitutional: Negative for chills and fever.  HENT: Positive for dental problem. Negative for sore throat, trouble swallowing and voice change.     Physical Exam Updated Vital Signs BP (!) 131/86 (BP Location: Left Arm)   Pulse 94   Temp 98.1 F (36.7 C) (Oral)   Resp 15   SpO2 100%   Physical Exam Vitals and nursing note reviewed.  Constitutional:      General: She is not in acute distress.    Appearance: She is well-developed. She is not diaphoretic.  HENT:     Head: Normocephalic and atraumatic.     Comments: Dental decay and cavity of the right lower third molar.  No obvious gingival erythema or fluctuance.  Uvula midline.  Normal phonation.  No trismus. Eyes:     General: No scleral icterus.    Conjunctiva/sclera: Conjunctivae normal.  Cardiovascular:     Rate and Rhythm: Normal rate and regular rhythm.     Heart sounds: Normal heart sounds. No murmur heard.  No friction rub. No gallop.   Pulmonary:     Effort:  Pulmonary effort is normal. No respiratory distress.     Breath sounds: Normal breath sounds.  Abdominal:     General: Bowel sounds are normal. There is no distension.     Palpations: Abdomen is soft. There is no mass.     Tenderness: There is no abdominal tenderness. There is no guarding.  Musculoskeletal:     Cervical back: Normal range of motion.  Skin:    General: Skin is warm and dry.  Neurological:     Mental Status: She is alert and oriented to person, place, and time.  Psychiatric:        Behavior: Behavior normal.     ED Results / Procedures / Treatments   Labs (all labs ordered are listed, but only abnormal results are displayed) Labs Reviewed - No data to display  EKG None  Radiology No results found.  Procedures Procedures (including critical care time)  Medications Ordered in ED Medications - No data to display  ED Course  I have reviewed the triage vital signs and the nursing notes.  Pertinent  labs & imaging results that were available during my care of the patient were reviewed by me and considered in my medical decision making (see chart for details).    MDM Rules/Calculators/A&P                          Patient with toothache.  No gross abscess.  Exam unconcerning for Ludwig's angina or spread of infection.  Will treat with penicillin and pain medicine.  Urged patient to follow-up with dentist.    Final Clinical Impression(s) / ED Diagnoses Final diagnoses:  Pain due to dental caries    Rx / DC Orders ED Discharge Orders    None       Arthor Captain, PA-C 11/06/19 1454    Benjiman Core, MD 11/06/19 [phone]

## 2019-11-06 NOTE — ED Triage Notes (Signed)
Patient complains of right upper broken tooth for several days, no relief with otc meds. Feels the pain in her ear

## 2020-06-30 ENCOUNTER — Other Ambulatory Visit: Payer: Self-pay

## 2020-06-30 ENCOUNTER — Ambulatory Visit (INDEPENDENT_AMBULATORY_CARE_PROVIDER_SITE_OTHER): Payer: Medicaid Other | Admitting: Psychiatry

## 2020-06-30 ENCOUNTER — Encounter (HOSPITAL_COMMUNITY): Payer: Self-pay | Admitting: Psychiatry

## 2020-06-30 DIAGNOSIS — F3130 Bipolar disorder, current episode depressed, mild or moderate severity, unspecified: Secondary | ICD-10-CM

## 2020-06-30 DIAGNOSIS — F4310 Post-traumatic stress disorder, unspecified: Secondary | ICD-10-CM | POA: Diagnosis not present

## 2020-06-30 DIAGNOSIS — F411 Generalized anxiety disorder: Secondary | ICD-10-CM

## 2020-06-30 MED ORDER — HYDROXYZINE HCL 10 MG PO TABS
10.0000 mg | ORAL_TABLET | Freq: Three times a day (TID) | ORAL | 2 refills | Status: DC | PRN
Start: 2020-06-30 — End: 2020-09-24

## 2020-06-30 MED ORDER — ARIPIPRAZOLE 5 MG PO TABS
5.0000 mg | ORAL_TABLET | Freq: Every day | ORAL | 2 refills | Status: DC
Start: 2020-06-30 — End: 2020-09-24

## 2020-06-30 MED ORDER — PRAZOSIN HCL 1 MG PO CAPS: 1.0000 mg | ORAL_CAPSULE | Freq: Every day | ORAL | 2 refills | Status: DC

## 2020-06-30 MED ORDER — TRAZODONE HCL 50 MG PO TABS: 50.0000 mg | ORAL_TABLET | Freq: Every evening | ORAL | 2 refills | Status: DC | PRN
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
## 2020-07-08 ENCOUNTER — Telehealth (HOSPITAL_COMMUNITY): Payer: Self-pay | Admitting: *Deleted

## 2020-07-08 NOTE — Telephone Encounter (Signed)
[place] TRACKS PRIOR AUTHORIZATION ARIPiprazole (ABILIFY) 5 MG tablet  P. A. # [id number]  EFFECTIVE:  07/01/2020    THRU   06/26/2021

## 2020-08-05 ENCOUNTER — Other Ambulatory Visit: Payer: Self-pay | Admitting: Internal Medicine

## 2020-08-06 LAB — CBC
HCT: 35.9 % (ref 35.0–45.0)
Hemoglobin: 11.2 g/dL — ABNORMAL LOW (ref 11.7–15.5)
MCH: 25.2 pg — ABNORMAL LOW (ref 27.0–33.0)
MCHC: 31.2 g/dL — ABNORMAL LOW (ref 32.0–36.0)
MCV: 80.7 fL (ref 80.0–100.0)
MPV: 13.1 fL — ABNORMAL HIGH (ref 7.5–12.5)
Platelets: 194 10*3/uL (ref 140–400)
RBC: 4.45 10*6/uL (ref 3.80–5.10)
RDW: 13.5 % (ref 11.0–15.0)
WBC: 5.5 10*3/uL (ref 3.8–10.8)

## 2020-08-06 LAB — IRON, TOTAL/TOTAL IRON BINDING CAP
%SAT: 11 % (calc) — ABNORMAL LOW (ref 16–45)
Iron: 51 ug/dL (ref 40–190)
TIBC: 462 mcg/dL (calc) — ABNORMAL HIGH (ref 250–450)

## 2020-08-06 LAB — RETICULOCYTES
ABS Retic: 35600 cells/uL (ref 20000–80000)
Retic Ct Pct: 0.8 %

## 2020-08-06 LAB — SICKLE CELL SCREEN: Sickle Solubility Test - HGBRFX: NEGATIVE

## 2020-08-06 LAB — FERRITIN: Ferritin: 6 ng/mL — ABNORMAL LOW (ref 16–154)

## 2020-08-06 LAB — B12 AND FOLATE PANEL
Folate: 10.7 ng/mL
Vitamin B-12: 270 pg/mL (ref 200–1100)

## 2020-08-06 LAB — VITAMIN D 25 HYDROXY (VIT D DEFICIENCY, FRACTURES): Vit D, 25-Hydroxy: 16 ng/mL — ABNORMAL LOW (ref 30–100)

## 2020-09-24 ENCOUNTER — Other Ambulatory Visit: Payer: Self-pay

## 2020-09-24 ENCOUNTER — Encounter (HOSPITAL_COMMUNITY): Payer: Self-pay | Admitting: Psychiatry

## 2020-09-24 ENCOUNTER — Ambulatory Visit (INDEPENDENT_AMBULATORY_CARE_PROVIDER_SITE_OTHER): Payer: Medicaid Other | Admitting: Psychiatry

## 2020-09-24 DIAGNOSIS — F3130 Bipolar disorder, current episode depressed, mild or moderate severity, unspecified: Secondary | ICD-10-CM | POA: Diagnosis not present

## 2020-09-24 DIAGNOSIS — F4310 Post-traumatic stress disorder, unspecified: Secondary | ICD-10-CM

## 2020-09-24 DIAGNOSIS — F411 Generalized anxiety disorder: Secondary | ICD-10-CM

## 2020-09-24 MED ORDER — TRAZODONE HCL 50 MG PO TABS: 100.0000 mg | ORAL_TABLET | Freq: Every evening | ORAL | 2 refills | Status: DC | PRN

## 2020-09-24 MED ORDER — ARIPIPRAZOLE 10 MG PO TABS: 10.0000 mg | ORAL_TABLET | Freq: Every day | ORAL | 2 refills | Status: DC

## 2020-09-24 MED ORDER — PRAZOSIN HCL 1 MG PO CAPS: 1.0000 mg | ORAL_CAPSULE | Freq: Every day | ORAL | 2 refills | Status: DC

## 2020-09-24 MED ORDER — HYDROXYZINE HCL 25 MG PO TABS: 25.0000 mg | ORAL_TABLET | Freq: Three times a day (TID) | ORAL | 2 refills | Status: DC | PRN
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
## 2020-10-10 ENCOUNTER — Ambulatory Visit: Payer: Medicaid Other | Admitting: Obstetrics

## 2020-12-25 ENCOUNTER — Encounter (HOSPITAL_COMMUNITY): Payer: Medicaid Other | Admitting: Psychiatry

## 2021-04-01 ENCOUNTER — Other Ambulatory Visit: Payer: Self-pay | Admitting: Internal Medicine

## 2021-04-02 LAB — COMPLETE METABOLIC PANEL WITH GFR
AG Ratio: 1.8 (calc) (ref 1.0–2.5)
ALT: 17 U/L (ref 6–29)
AST: 19 U/L (ref 10–30)
Albumin: 4.7 g/dL (ref 3.6–5.1)
Alkaline phosphatase (APISO): 36 U/L (ref 31–125)
BUN: 21 mg/dL (ref 7–25)
CO2: 25 mmol/L (ref 20–32)
Calcium: 9.3 mg/dL (ref 8.6–10.2)
Chloride: 104 mmol/L (ref 98–110)
Creat: 0.79 mg/dL (ref 0.50–0.97)
Globulin: 2.6 g/dL (calc) (ref 1.9–3.7)
Glucose, Bld: 83 mg/dL (ref 65–99)
Potassium: 4.2 mmol/L (ref 3.5–5.3)
Sodium: 140 mmol/L (ref 135–146)
Total Bilirubin: 0.3 mg/dL (ref 0.2–1.2)
Total Protein: 7.3 g/dL (ref 6.1–8.1)
eGFR: 102 mL/min/{1.73_m2} (ref >=60)

## 2021-04-02 LAB — LIPID PANEL
Cholesterol: 166 mg/dL (ref <200)
HDL: 67 mg/dL (ref >=50)
LDL Cholesterol (Calc): 81 mg/dL (calc)
Non-HDL Cholesterol (Calc): 99 mg/dL (calc) (ref <130)
Total CHOL/HDL Ratio: 2.5 (calc) (ref <5.0)
Triglycerides: 94 mg/dL (ref <150)

## 2021-04-02 LAB — CBC
HCT: 37.2 % (ref 35.0–45.0)
Hemoglobin: 11.8 g/dL (ref 11.7–15.5)
MCH: 26.1 pg — ABNORMAL LOW (ref 27.0–33.0)
MCHC: 31.7 g/dL — ABNORMAL LOW (ref 32.0–36.0)
MCV: 82.3 fL (ref 80.0–100.0)
MPV: 13.3 fL — ABNORMAL HIGH (ref 7.5–12.5)
Platelets: 186 10*3/uL (ref 140–400)
RBC: 4.52 10*6/uL (ref 3.80–5.10)
RDW: 13.1 % (ref 11.0–15.0)
WBC: 5.9 10*3/uL (ref 3.8–10.8)

## 2021-04-02 LAB — VITAMIN D 25 HYDROXY (VIT D DEFICIENCY, FRACTURES): Vit D, 25-Hydroxy: 12 ng/mL — ABNORMAL LOW (ref 30–100)

## 2021-05-26 ENCOUNTER — Encounter: Payer: Self-pay | Admitting: Family Medicine

## 2021-05-26 ENCOUNTER — Encounter (INDEPENDENT_AMBULATORY_CARE_PROVIDER_SITE_OTHER): Payer: Self-pay

## 2021-05-26 ENCOUNTER — Other Ambulatory Visit: Payer: Self-pay

## 2021-05-26 ENCOUNTER — Ambulatory Visit (INDEPENDENT_AMBULATORY_CARE_PROVIDER_SITE_OTHER): Payer: Medicaid Other | Admitting: Family Medicine

## 2021-05-26 VITALS — BP 121/77 | HR 84 | Temp 98.2°F | Resp 18 | Ht 67.5 in | Wt 187.0 lb

## 2021-05-26 DIAGNOSIS — Z7689 Persons encountering health services in other specified circumstances: Secondary | ICD-10-CM | POA: Diagnosis not present

## 2021-05-26 DIAGNOSIS — Z3201 Encounter for pregnancy test, result positive: Secondary | ICD-10-CM | POA: Diagnosis not present

## 2021-05-26 DIAGNOSIS — N939 Abnormal uterine and vaginal bleeding, unspecified: Secondary | ICD-10-CM

## 2021-05-26 LAB — POCT URINE PREGNANCY: Preg Test, Ur: POSITIVE — AB

## 2021-05-26 NOTE — Progress Notes (Signed)
Patient has eaten today. Patient has taken no medication today. Patient denies pain at this time.

## 2021-05-26 NOTE — Progress Notes (Signed)
New Patient Office Visit  Subjective:  Patient ID: Yvonne Jenkins, female    DOB: 1990/10/16  Age: 31 y.o. MRN: [id number]  CC:  Chief Complaint  Patient presents with   Dysmenorrhea    HPI Yvonne Jenkins presents for to establish care. Patient reports that she was on birth control until about 6 months ago. She has been having unprotected intercourse but had an episode of abnormal vaginal bleeding and a urine preg test was positive. She has had some cramping.   Past Medical History:  Diagnosis Date   Asthma     History reviewed. No pertinent surgical history.  History reviewed. No pertinent family history.  Social History   Socioeconomic History   Marital status: Single    Spouse name: Not on file   Number of children: Not on file   Years of education: Not on file   Highest education level: Not on file  Occupational History   Not on file  Tobacco Use   Smoking status: Every Day    Packs/day: 0.25    Types: Cigarettes   Smokeless tobacco: Never  Substance and Sexual Activity   Alcohol use: No   Drug use: No   Sexual activity: Yes  Other Topics Concern   Not on file  Social History Narrative   Not on file   Social Determinants of Health   Financial Resource Strain: Not on file  Food Insecurity: Not on file  Transportation Needs: Not on file  Physical Activity: Not on file  Stress: Not on file  Social Connections: Not on file  Intimate Partner Violence: Not on file    ROS Review of Systems  Genitourinary:  Positive for vaginal bleeding. Negative for vaginal discharge.  All other systems reviewed and are negative.  Objective:   Today's Vitals: BP 121/77 (BP Location: Left Arm, Patient Position: Sitting, Cuff Size: Normal)    Pulse 84    Temp 98.2 F (36.8 C) (Oral)    Resp 18    Ht 5' 7.5" (1.715 m)    Wt 187 lb (84.8 kg)    LMP 04/13/2021    SpO2 100%    BMI 28.86 kg/m   Physical Exam Vitals and nursing note reviewed.  Constitutional:      General:  She is not in acute distress. Cardiovascular:     Rate and Rhythm: Normal rate and regular rhythm.  Pulmonary:     Effort: Pulmonary effort is normal.     Breath sounds: Normal breath sounds.  Abdominal:     Palpations: Abdomen is soft.     Tenderness: There is no abdominal tenderness.  Neurological:     General: No focal deficit present.     Mental Status: She is alert and oriented to person, place, and time.    Assessment & Plan:    1. Positive urine pregnancy test Begin prenatal vitamins. Serum HCG ordered - US Pelvic Complete With Transvaginal; Future  2. Vaginal bleeding Referral for u/s - POCT urine pregnancy - hCG, serum, qualitative - Basic Metabolic Panel - CBC with Differential - US Pelvic Complete With Transvaginal; Future  3. Encounter to establish care    Outpatient Encounter Medications as of 05/26/2021  Medication Sig   ARIPiprazole (ABILIFY) 10 MG tablet Take 1 tablet (10 mg total) by mouth daily. (Patient not taking: Reported on 05/26/2021)   hydrOXYzine (ATARAX/VISTARIL) 25 MG tablet Take 1 tablet (25 mg total) by mouth 3 (three) times daily as needed. (Patient not taking: Reported on  05/26/2021)   ibuprofen (ADVIL,MOTRIN) 600 MG tablet Take 1 tablet (600 mg total) by mouth every 6 (six) hours as needed. (Patient not taking: Reported on 07/19/2019)   prazosin (MINIPRESS) 1 MG capsule Take 1 capsule (1 mg total) by mouth at bedtime. (Patient not taking: Reported on 05/26/2021)   traZODone (DESYREL) 50 MG tablet Take 2 tablets (100 mg total) by mouth at bedtime as needed for sleep. (Patient not taking: Reported on 05/26/2021)   [DISCONTINUED] albuterol (PROVENTIL HFA;VENTOLIN HFA) 108 (90 Base) MCG/ACT inhaler Inhale 1-2 puffs into the lungs every 6 (six) hours as needed for wheezing or shortness of breath.   [DISCONTINUED] montelukast (SINGULAIR) 10 MG tablet Take 1 tablet (10 mg total) by mouth at bedtime.   [DISCONTINUED] naproxen (NAPROSYN) 500 MG tablet Take 1  tablet (500 mg total) by mouth 2 (two) times daily.   [DISCONTINUED] predniSONE (DELTASONE) 50 MG tablet Take 1 tablet (50 mg total) by mouth daily with breakfast.   No facility-administered encounter medications on file as of 05/26/2021.    Follow-up: No follow-ups on file.   Becky Sax, MD

## 2021-05-27 ENCOUNTER — Other Ambulatory Visit: Payer: Self-pay

## 2021-05-27 ENCOUNTER — Inpatient Hospital Stay (HOSPITAL_COMMUNITY): Payer: Medicaid Other

## 2021-05-27 ENCOUNTER — Inpatient Hospital Stay (HOSPITAL_COMMUNITY)
Admission: AD | Admit: 2021-05-27 | Discharge: 2021-05-27 | Disposition: A | Discharge status: Home / Self Care | Payer: Medicaid Other | Attending: Obstetrics & Gynecology | Admitting: Obstetrics & Gynecology

## 2021-05-27 ENCOUNTER — Encounter (HOSPITAL_COMMUNITY): Payer: Self-pay | Admitting: Obstetrics & Gynecology

## 2021-05-27 DIAGNOSIS — Z3A01 Less than 8 weeks gestation of pregnancy: Secondary | ICD-10-CM | POA: Diagnosis not present

## 2021-05-27 DIAGNOSIS — O208 Other hemorrhage in early pregnancy: Secondary | ICD-10-CM | POA: Insufficient documentation

## 2021-05-27 DIAGNOSIS — O30041 Twin pregnancy, dichorionic/diamniotic, first trimester: Secondary | ICD-10-CM | POA: Insufficient documentation

## 2021-05-27 DIAGNOSIS — F17210 Nicotine dependence, cigarettes, uncomplicated: Secondary | ICD-10-CM | POA: Diagnosis not present

## 2021-05-27 DIAGNOSIS — O30001 Twin pregnancy, unspecified number of placenta and unspecified number of amniotic sacs, first trimester: Secondary | ICD-10-CM

## 2021-05-27 DIAGNOSIS — O209 Hemorrhage in early pregnancy, unspecified: Secondary | ICD-10-CM | POA: Diagnosis present

## 2021-05-27 DIAGNOSIS — Z79899 Other long term (current) drug therapy: Secondary | ICD-10-CM | POA: Insufficient documentation

## 2021-05-27 DIAGNOSIS — O99331 Smoking (tobacco) complicating pregnancy, first trimester: Secondary | ICD-10-CM | POA: Insufficient documentation

## 2021-05-27 LAB — BASIC METABOLIC PANEL
BUN/Creatinine Ratio: 19 (ref 9–23)
BUN: 14 mg/dL (ref 6–20)
CO2: 21 mmol/L (ref 20–29)
Calcium: 9 mg/dL (ref 8.7–10.2)
Chloride: 103 mmol/L (ref 96–106)
Creatinine, Ser: 0.75 mg/dL (ref 0.57–1.00)
Glucose: 76 mg/dL (ref 70–99)
Potassium: 3.7 mmol/L (ref 3.5–5.2)
Sodium: 138 mmol/L (ref 134–144)
eGFR: 108 mL/min/{1.73_m2} (ref >59)

## 2021-05-27 LAB — CBC WITH DIFFERENTIAL/PLATELET
Basophils Absolute: 0.1 10*3/uL (ref 0.0–0.2)
Basos: 1 %
EOS (ABSOLUTE): 0.3 10*3/uL (ref 0.0–0.4)
Eos: 3 %
Hematocrit: 34.7 % (ref 34.0–46.6)
Hemoglobin: 11.1 g/dL (ref 11.1–15.9)
Immature Grans (Abs): 0 10*3/uL (ref 0.0–0.1)
Immature Granulocytes: 0 %
Lymphocytes Absolute: 1.1 10*3/uL (ref 0.7–3.1)
Lymphs: 15 %
MCH: 26.2 pg — ABNORMAL LOW (ref 26.6–33.0)
MCHC: 32 g/dL (ref 31.5–35.7)
MCV: 82 fL (ref 79–97)
Monocytes Absolute: 0.6 10*3/uL (ref 0.1–0.9)
Monocytes: 7 %
Neutrophils Absolute: 5.7 10*3/uL (ref 1.4–7.0)
Neutrophils: 74 %
Platelets: 184 10*3/uL (ref 150–450)
RBC: 4.23 x10E6/uL (ref 3.77–5.28)
RDW: 14.3 % (ref 11.7–15.4)
WBC: 7.7 10*3/uL (ref 3.4–10.8)

## 2021-05-27 LAB — CBC
HCT: 31.4 % — ABNORMAL LOW (ref 36.0–46.0)
Hemoglobin: 10.4 g/dL — ABNORMAL LOW (ref 12.0–15.0)
MCH: 26.9 pg (ref 26.0–34.0)
MCHC: 33.1 g/dL (ref 30.0–36.0)
MCV: 81.1 fL (ref 80.0–100.0)
Platelets: 174 10*3/uL (ref 150–400)
RBC: 3.87 MIL/uL (ref 3.87–5.11)
RDW: 14.6 % (ref 11.5–15.5)
WBC: 8.6 10*3/uL (ref 4.0–10.5)
nRBC: 0 % (ref 0.0–0.2)

## 2021-05-27 LAB — WET PREP, GENITAL
Sperm: NONE SEEN
Trich, Wet Prep: NONE SEEN
WBC, Wet Prep HPF POC: <10 (ref <10)
Yeast Wet Prep HPF POC: NONE SEEN

## 2021-05-27 LAB — ABO/RH: ABO/RH(D): A POS

## 2021-05-27 LAB — HCG, QUANTITATIVE, PREGNANCY: hCG, Beta Chain, Quant, S: 101456 m[IU]/mL — ABNORMAL HIGH (ref <5)

## 2021-05-27 LAB — HCG, SERUM, QUALITATIVE: hCG,Beta Subunit,Qual,Serum: POSITIVE m[IU]/mL — AB (ref <6)

## 2021-05-27 MED ORDER — METRONIDAZOLE 500 MG PO TABS: 500.0000 mg | ORAL_TABLET | Freq: Two times a day (BID) | ORAL | 0 refills | Status: DC

## 2021-05-27 NOTE — MAU Note (Signed)
..  Yvonne Jenkins is a 31 y.o. at Unknown here in MAU reporting: Pt found out she was pregnant at home last week. She started bleeding and cramping last week shortly after she found. Pt reports it was heavy bleeding at first. Pt stated it stopped later in the week. Pt states Friday and today she had a blood clot with red watery bleeding. Pt went to her PCP yesterday for confirmation and evaluate bleeding. Today, the bleeding is watery and heavy, pt changed a pad every time she pees. Pt reports she is not cramping today. Pt denies abnormal discharge and LOF. LMP: 04/25/2021 Onset of complaint:  Pain score: 0/10 Vitals:   05/27/21 1925  BP: 127/75  Pulse: 96  Resp: 18  SpO2: 100%      Lab orders placed from triage: none

## 2021-05-27 NOTE — Discharge Instructions (Signed)
Prenatal Care Providers           [facility]  [address] [phone]  [facility]   [address]  [phone]  [facility] [place]       [address] [phone]            [facility] @      [address] [phone]          [facility] [place]   [address] [phone]  [facility]  [address] [phone]     [facility]  [address]   [phone]     [facility]  Phone: [phone]  [facility]  Phone: [phone]  [facility] Phone: [phone]  [facility] Phone: [phone]  [facility] Phone: [phone]  [facility] Phone: [phone]  [facility]  Phone: [phone] Safe Medications in Pregnancy   Acne: Benzoyl Peroxide Salicylic Acid  Backache/Headache: Tylenol: 2 regular strength every 4 hours OR              2 Extra strength every 6 hours  Colds/Coughs/Allergies: Benadryl (alcohol free) 25 mg every 6 hours as needed Breath right strips Claritin Cepacol throat lozenges Chloraseptic throat spray Cold-Eeze- up to three times per day Cough drops, alcohol free Flonase (by prescription only) Guaifenesin Mucinex Robitussin DM (plain only, alcohol free) Saline nasal spray/drops Sudafed (pseudoephedrine) & Actifed ** use only after [redacted] weeks gestation and if you do not have high blood pressure Tylenol Vicks Vaporub Zinc lozenges Zyrtec   Constipation: Colace Ducolax suppositories Fleet enema Glycerin suppositories Metamucil Milk of magnesia Miralax Senokot Smooth move tea  Diarrhea: Kaopectate Imodium A-D  *NO pepto  Bismol  Hemorrhoids: Anusol Anusol HC Preparation H Tucks  Indigestion: Tums Maalox Mylanta Zantac  Pepcid  Insomnia: Benadryl (alcohol free) 25mg every 6 hours as needed Tylenol PM Unisom, no Gelcaps  Leg Cramps: Tums MagGel  Nausea/Vomiting:  Bonine Dramamine Emetrol Ginger extract Sea bands Meclizine  Nausea medication to take during pregnancy:  Unisom (doxylamine succinate 25 mg tablets) Take one tablet daily at bedtime. If symptoms are not adequately controlled, the dose can be increased to a maximum recommended dose of two tablets daily (1/2 tablet in the morning, 1/2 tablet mid-afternoon and one at bedtime). Vitamin B6 100mg tablets. Take one tablet twice a day (up to 200 mg per day).  Skin Rashes: Aveeno products Benadryl cream or 25mg every 6 hours as needed Calamine Lotion 1% cortisone cream  Yeast infection: Gyne-lotrimin 7 Monistat 7   **If taking multiple medications, please check labels to avoid duplicating the same active ingredients **take medication as directed on the label ** Do not exceed 4000 mg of tylenol in 24 hours **Do not take medications that contain aspirin or ibuprofen    

## 2021-05-27 NOTE — MAU Provider Note (Signed)
History     CSN: [id number]  Arrival date and time: 05/27/21 1902   Event Date/Time   First Provider Initiated Contact with Patient 05/27/21 2016      Chief Complaint  Patient presents with   Vaginal Bleeding   HPI Yvonne Jenkins is a 31 y.o. G2P0 at [redacted]w[redacted]d who presents with bleeding x1 week. She states she has consistently seen bleeding when she wipes but today it got heavier and she is changing a pad every time she goes to the bathroom. She denies any pain. She had her pregnancy confirmed at her PCP yesterday.   OB History     Gravida  6   Para  5   Term      Preterm      AB      Living  5      SAB      IAB      Ectopic      Multiple      Live Births  5           Past Medical History:  Diagnosis Date   Asthma     No past surgical history on file.  No family history on file.  Social History   Tobacco Use   Smoking status: Every Day    Packs/day: 0.25    Types: Cigarettes   Smokeless tobacco: Never  Substance Use Topics   Alcohol use: No   Drug use: No    Allergies: No Known Allergies  Medications Prior to Admission  Medication Sig Dispense Refill Last Dose   ARIPiprazole (ABILIFY) 10 MG tablet Take 1 tablet (10 mg total) by mouth daily. (Patient not taking: Reported on 05/26/2021) 30 tablet 2    hydrOXYzine (ATARAX/VISTARIL) 25 MG tablet Take 1 tablet (25 mg total) by mouth 3 (three) times daily as needed. (Patient not taking: Reported on 05/26/2021) 90 tablet 2    ibuprofen (ADVIL,MOTRIN) 600 MG tablet Take 1 tablet (600 mg total) by mouth every 6 (six) hours as needed. (Patient not taking: Reported on 07/19/2019) 30 tablet 0    prazosin (MINIPRESS) 1 MG capsule Take 1 capsule (1 mg total) by mouth at bedtime. (Patient not taking: Reported on 05/26/2021) 30 capsule 2    traZODone (DESYREL) 50 MG tablet Take 2 tablets (100 mg total) by mouth at bedtime as needed for sleep. (Patient not taking: Reported on 05/26/2021) 30 tablet 2     Review of  Systems  Constitutional: Negative.  Negative for fatigue and fever.  HENT: Negative.    Respiratory: Negative.  Negative for shortness of breath.   Cardiovascular: Negative.  Negative for chest pain.  Gastrointestinal: Negative.  Negative for abdominal pain, constipation, diarrhea, nausea and vomiting.  Genitourinary:  Positive for vaginal bleeding. Negative for dysuria and vaginal discharge.  Neurological: Negative.  Negative for dizziness and headaches.  Physical Exam   Blood pressure 127/75, pulse 96, temperature 98.6 F (37 C), temperature source Oral, resp. rate 18, height 5\' 8"  (1.727 m), weight 86.1 kg, last menstrual period 04/25/2021, SpO2 100 %, unknown if currently breastfeeding.  Physical Exam Vitals and nursing note reviewed.  Constitutional:      General: She is not in acute distress.    Appearance: She is well-developed.  HENT:     Head: Normocephalic.  Eyes:     Pupils: Pupils are equal, round, and reactive to light.  Cardiovascular:     Rate and Rhythm: Normal rate and regular rhythm.  Heart sounds: Normal heart sounds.  Pulmonary:     Effort: Pulmonary effort is normal. No respiratory distress.     Breath sounds: Normal breath sounds.  Abdominal:     General: Bowel sounds are normal. There is no distension.     Palpations: Abdomen is soft.     Tenderness: There is no abdominal tenderness.  Genitourinary:    Comments: Small amount of bleeding noted Skin:    General: Skin is warm and dry.  Neurological:     Mental Status: She is alert and oriented to person, place, and time.  Psychiatric:        Mood and Affect: Mood normal.        Behavior: Behavior normal.        Thought Content: Thought content normal.        Judgment: Judgment normal.    MAU Course  Procedures Results for orders placed or performed during the hospital encounter of 05/27/21 (from the past 24 hour(s))  CBC     Status: Abnormal   Collection Time: 05/27/21  7:23 PM  Result Value  Ref Range   WBC 8.6 4.0 - 10.5 K/uL   RBC 3.87 3.87 - 5.11 MIL/uL   Hemoglobin 10.4 (L) 12.0 - 15.0 g/dL   HCT 74.2 (L) 59.5 - 63.8 %   MCV 81.1 80.0 - 100.0 fL   MCH 26.9 26.0 - 34.0 pg   MCHC 33.1 30.0 - 36.0 g/dL   RDW 75.6 43.3 - 29.5 %   Platelets 174 150 - 400 K/uL   nRBC 0.0 0.0 - 0.2 %  ABO/Rh     Status: None   Collection Time: 05/27/21  7:23 PM  Result Value Ref Range   ABO/RH(D) A POS    No rh immune globuloin      NOT A RH IMMUNE GLOBULIN CANDIDATE, PT RH POSITIVE Performed at [facility], [address], [place], [place] [address]   Wet prep, genital     Status: Abnormal   Collection Time: 05/27/21  7:55 PM  Result Value Ref Range   Yeast Wet Prep HPF POC NONE SEEN NONE SEEN   Trich, Wet Prep NONE SEEN NONE SEEN   Clue Cells Wet Prep HPF POC PRESENT (A) NONE SEEN   WBC, Wet Prep HPF POC <10 <10   Sperm NONE SEEN     US OB Comp Less 14 Wks  Result Date: 05/27/2021 CLINICAL DATA:  Bleeding for 1 week. EXAM: TWIN OBSTETRICAL ULTRASOUND <14 WKS TECHNIQUE: Transabdominal ultrasound was performed for evaluation of the gestation as well as the maternal uterus and adnexal regions. COMPARISON:  None. FINDINGS: Number of IUPs:  2 Chorionicity/Amnionicity:  Dichorionic/diamniotic TWIN 1 Yolk sac:  Yes Embryo:  Yes Cardiac Activity: Yes Heart Rate: 160 bpm CRL:   13.3 mm   7 w 4 d                  [place] EDC: 01/09/2022. TWIN 2 Yolk sac:  Yes Embryo:  Yes Cardiac Activity: Yes Heart Rate: 158 bpm CRL:   12.4 mm   7 w 3 d                  [place] EDC: 01/10/2022. Subchorionic hemorrhage: There is a large subchorionic hemorrhage measuring greater than 5 cm surrounding and between the gestational sacs. Maternal uterus/adnexae: No free fluid in the pelvis. The ovaries are within normal limits. IMPRESSION: 1. Twin live intrauterine pregnancy, dichorionic/diamniotic. Twin 1 estimated gestational  age is 7 weeks 4 days. Twenty-two estimated gestational age is 7 weeks 3 days. 2. Large  subchorionic hemorrhage measuring greater than 5 cm surrounding and in between the gestational sac. OBGYN consultation and short-term follow-up is recommended. Electronically Signed   By: Thornell Sartorius M.D.   On: 05/27/2021 20:29   US OB Comp AddL Gest Less 14 Wks  Result Date: 05/27/2021 CLINICAL DATA:  Bleeding for 1 week. EXAM: TWIN OBSTETRICAL ULTRASOUND <14 WKS TECHNIQUE: Transabdominal ultrasound was performed for evaluation of the gestation as well as the maternal uterus and adnexal regions. COMPARISON:  None. FINDINGS: Number of IUPs:  2 Chorionicity/Amnionicity:  Dichorionic/diamniotic TWIN 1 Yolk sac:  Yes Embryo:  Yes Cardiac Activity: Yes Heart Rate: 160 bpm CRL:   13.3 mm   7 w 4 d                  [place] EDC: 01/09/2022. TWIN 2 Yolk sac:  Yes Embryo:  Yes Cardiac Activity: Yes Heart Rate: 158 bpm CRL:   12.4 mm   7 w 3 d                  [place] EDC: 01/10/2022. Subchorionic hemorrhage: There is a large subchorionic hemorrhage measuring greater than 5 cm surrounding and between the gestational sacs. Maternal uterus/adnexae: No free fluid in the pelvis. The ovaries are within normal limits. IMPRESSION: 1. Twin live intrauterine pregnancy, dichorionic/diamniotic. Twin 1 estimated gestational age is 7 weeks 4 days. Twenty-two estimated gestational age is 7 weeks 3 days. 2. Large subchorionic hemorrhage measuring greater than 5 cm surrounding and in between the gestational sac. OBGYN consultation and short-term follow-up is recommended. Electronically Signed   By: Thornell Sartorius M.D.   On: 05/27/2021 20:29     MDM UA, UPT CBC, HCG ABO/Rh- A Pos Wet prep and gc/chlamydia US OB Comp Less 14 weeks with Transvaginal  Reviewed results of subchorionic hemorrhage with patient and partner. Discussed that this is a common finding in the first trimester and does not usually cause problems in the pregnancy like loss or difficulty with development. Reviewed expectations for vaginal bleeding including a small amount  possibly for several weeks. Reviewed warning signs of heavy bleeding, saturating a pad in less than an hour, and severe pain as reasons to come back to MAU. Encouraged patient to exercise pelvic rest until 7 days after bleeding stops. Patient and support person verbalized understanding. Given large size of SCH, will repeat ultrasound in 2 weeks   Assessment and Plan   1. Twin gestation in first trimester, unspecified multiple gestation type   2. Vaginal bleeding affecting early pregnancy   3. [redacted] weeks gestation of pregnancy    -Discharge home in stable condition -Bleeding precautions discussed -Patient advised to follow-up with [facility] in 2 weeks for repeat scan, order placed -Patient may return to MAU as needed or if her condition were to change or worsen   Rolm Bookbinder CNM 05/27/2021, 8:34 PM

## 2021-05-28 LAB — GC/CHLAMYDIA PROBE AMP (~~LOC~~) NOT AT ARMC
Chlamydia: NEGATIVE
Comment: NEGATIVE
Comment: NORMAL
Neisseria Gonorrhea: NEGATIVE

## 2021-06-09 ENCOUNTER — Telehealth: Payer: Self-pay | Admitting: Family Medicine

## 2021-06-09 NOTE — Telephone Encounter (Signed)
Patient is unclear of the [place] she should need to be scheduled for, state the hospital put two different orders in.

## 2021-06-10 ENCOUNTER — Telehealth: Payer: Self-pay | Admitting: Family Medicine

## 2021-06-10 NOTE — Telephone Encounter (Signed)
Pt dropped off document from [facility] of Social Services requiring the current Immunizations of her 4 children, Dates examined and Physician Signature for the 4 children.  ? ?Yvonne Jenkins DOB [birth year]/09/2020  ? ?Yvonne Jenkins DOB 11/24/2018 ? ?Yvonne Jenkins DOB 11/25/2015 ? ?Yvonne Jenkins DOB 08/24/2013  ?

## 2021-06-11 ENCOUNTER — Ambulatory Visit
Admission: RE | Admit: 2021-06-11 | Discharge: 2021-06-11 | Disposition: A | Discharge status: Home / Self Care | Payer: Medicaid Other | Source: Ambulatory Visit

## 2021-06-11 ENCOUNTER — Ambulatory Visit (INDEPENDENT_AMBULATORY_CARE_PROVIDER_SITE_OTHER): Payer: Medicaid Other

## 2021-06-11 ENCOUNTER — Other Ambulatory Visit: Payer: Self-pay

## 2021-06-11 DIAGNOSIS — O209 Hemorrhage in early pregnancy, unspecified: Secondary | ICD-10-CM

## 2021-06-11 DIAGNOSIS — O30001 Twin pregnancy, unspecified number of placenta and unspecified number of amniotic sacs, first trimester: Secondary | ICD-10-CM | POA: Insufficient documentation

## 2021-06-11 DIAGNOSIS — Z3A01 Less than 8 weeks gestation of pregnancy: Secondary | ICD-10-CM | POA: Insufficient documentation

## 2021-06-11 NOTE — Progress Notes (Signed)
Pt waiting in lobby for over 1 hour. Spoke with Dr Alysia Penna. Ok to let pt go home and call with results.  ? ?Reviewed results with Dr Alysia Penna once back. Pt advised to continue to monitor bleeding and that both babies had heartrates.  ?Call placed to pt. Spoke with pt. Gave results per Dr Alysia Penna. Pt verbalized understanding and agreeable to plan of care. Pt scheduled for OB Intake on 3/15 at 915am. Pt agreeable to date and time of virtual appt.  ? ?Janayah Zavada,RN  ?

## 2021-06-11 NOTE — Telephone Encounter (Signed)
done

## 2021-06-11 NOTE — Telephone Encounter (Signed)
This has been filled out and sent to PCP for signatures  ?

## 2021-06-14 ENCOUNTER — Inpatient Hospital Stay (HOSPITAL_COMMUNITY): Payer: Medicaid Other

## 2021-06-14 ENCOUNTER — Inpatient Hospital Stay (HOSPITAL_COMMUNITY)
Admission: AD | Admit: 2021-06-14 | Discharge: 2021-06-14 | Disposition: A | Discharge status: Home / Self Care | Payer: Medicaid Other | Attending: Obstetrics and Gynecology | Admitting: Obstetrics and Gynecology

## 2021-06-14 ENCOUNTER — Encounter (HOSPITAL_COMMUNITY): Payer: Self-pay | Admitting: Obstetrics and Gynecology

## 2021-06-14 ENCOUNTER — Other Ambulatory Visit: Payer: Self-pay

## 2021-06-14 DIAGNOSIS — O30041 Twin pregnancy, dichorionic/diamniotic, first trimester: Secondary | ICD-10-CM | POA: Diagnosis not present

## 2021-06-14 DIAGNOSIS — Z3A10 10 weeks gestation of pregnancy: Secondary | ICD-10-CM

## 2021-06-14 DIAGNOSIS — O26891 Other specified pregnancy related conditions, first trimester: Secondary | ICD-10-CM | POA: Insufficient documentation

## 2021-06-14 DIAGNOSIS — R109 Unspecified abdominal pain: Secondary | ICD-10-CM | POA: Insufficient documentation

## 2021-06-14 DIAGNOSIS — O208 Other hemorrhage in early pregnancy: Secondary | ICD-10-CM | POA: Diagnosis not present

## 2021-06-14 DIAGNOSIS — O468X1 Other antepartum hemorrhage, first trimester: Secondary | ICD-10-CM

## 2021-06-14 DIAGNOSIS — O0941 Supervision of pregnancy with grand multiparity, first trimester: Secondary | ICD-10-CM | POA: Insufficient documentation

## 2021-06-14 DIAGNOSIS — O418X12 Other specified disorders of amniotic fluid and membranes, first trimester, fetus 2: Secondary | ICD-10-CM

## 2021-06-14 DIAGNOSIS — O4690 Antepartum hemorrhage, unspecified, unspecified trimester: Secondary | ICD-10-CM

## 2021-06-14 MED ORDER — ONDANSETRON HCL 8 MG PO TABS: 8.0000 mg | ORAL_TABLET | Freq: Three times a day (TID) | ORAL | 0 refills | Status: DC | PRN

## 2021-06-14 NOTE — MAU Note (Signed)
Pt reports to mau with c/o passing 2 large blood clots  a few hours ago.  States she bled through her pants.  Denies pain.     ?

## 2021-06-14 NOTE — MAU Provider Note (Signed)
?History  ?  ? ?CSN: [id number] ? ?Arrival date and time: 06/14/21 1517 ? ? None  ?  ? ?Chief Complaint  ?Patient presents with  ? Vaginal Bleeding  ? ?HPI ? ?Yvonne Jenkins is a 31 y.o. female [phone] @ [redacted]w[redacted]d with known di/di twins and a large subchorionic hemorrhage here with heavy bleeding. She reports seeing and passing a large clot today. She has no pain 0/10. She has started care at the [facility]. She is concerned and does not know what is ok and what is not.  ? ?OB History   ? ? Gravida  ?6  ? Para  ?5  ? Term  ?5  ? Preterm  ?   ? AB  ?   ? Living  ?5  ?  ? ? SAB  ?   ? IAB  ?   ? Ectopic  ?   ? Multiple  ?   ? Live Births  ?5  ?   ?  ?  ? ? ?Past Medical History:  ?Diagnosis Date  ? Asthma   ? ? ?History reviewed. No pertinent surgical history. ? ?History reviewed. No pertinent family history. ? ?Social History  ? ?Tobacco Use  ? Smoking status: Every Day  ?  Packs/day: 0.25  ?  Types: Cigarettes  ? Smokeless tobacco: Never  ?Substance Use Topics  ? Alcohol use: No  ? Drug use: No  ? ? ?Allergies: No Known Allergies ? ?Medications Prior to Admission  ?Medication Sig Dispense Refill Last Dose  ? metroNIDAZOLE (FLAGYL) 500 MG tablet Take 1 tablet (500 mg total) by mouth 2 (two) times daily. 14 tablet 0   ? ?US OB Comp Less 14 Wks ? ?Result Date: 06/14/2021 ?CLINICAL DATA:  Vaginal bleeding during pregnancy. EXAM: TWIN OBSTETRICAL ULTRASOUND <14 WKS TECHNIQUE: Transabdominal ultrasound was performed for evaluation of the gestation as well as the maternal uterus and adnexal regions. COMPARISON:  Obstetric ultrasound 3 days ago 06/11/2021 FINDINGS: Number of IUPs:  2 Chorionicity/Amnionicity:  Dichorionic-diamniotic (thick membrane) TWIN 1 Yolk sac:  Visualized. Embryo:  Visualized. Cardiac Activity: Visualized. Heart Rate: 183 bpm CRL:   38.5 mm   10 w 5 d                  [place] EDC: 01/05/2022 TWIN 2 Yolk sac:  Visualized. Embryo:  Visualized. Cardiac Activity: Visualized. Heart Rate: 173 bpm CRL:   30.2 mm   9  w 6 d                  [place] EDC: 01/11/2022 Subchorionic hemorrhage: Large measuring 7.7 x 7.5 x 4.8 cm, this may be slightly diminished in size from prior exam. Maternal uterus/adnexae: There again may be myometrial thinning adjacent to the gestational sac for twin B. both ovaries are visualized and are normal. There is no adnexal mass or pelvic free fluid. IMPRESSION: 1. Live twin intrauterine pregnancy. There is again mildly asymmetric crown-rump length and gestational age, although there has been interval fetal growth of both fetus A and B from prior exam. 2. Large subchorionic hemorrhage, possibly slightly diminished in size from prior. 3. There is mild myometrial thinning overlying the gestational sac for twin B, which appears similar to prior. Recommend attention at follow-up. Electronically Signed   By: Keith Rake M.D.   On: 06/14/2021 18:09  ? ?US OB Comp AddL Gest Less 14 Wks ? ?Result Date: 06/14/2021 ?CLINICAL DATA:  Vaginal bleeding during pregnancy. EXAM: TWIN OBSTETRICAL ULTRASOUND <  14 WKS TECHNIQUE: Transabdominal ultrasound was performed for evaluation of the gestation as well as the maternal uterus and adnexal regions. COMPARISON:  Obstetric ultrasound 3 days ago 06/11/2021 FINDINGS: Number of IUPs:  2 Chorionicity/Amnionicity:  Dichorionic-diamniotic (thick membrane) TWIN 1 Yolk sac:  Visualized. Embryo:  Visualized. Cardiac Activity: Visualized. Heart Rate: 183 bpm CRL:   38.5 mm   10 w 5 d                  [place] EDC: 01/05/2022 TWIN 2 Yolk sac:  Visualized. Embryo:  Visualized. Cardiac Activity: Visualized. Heart Rate: 173 bpm CRL:   30.2 mm   9 w 6 d                  [place] EDC: 01/11/2022 Subchorionic hemorrhage: Large measuring 7.7 x 7.5 x 4.8 cm, this may be slightly diminished in size from prior exam. Maternal uterus/adnexae: There again may be myometrial thinning adjacent to the gestational sac for twin B. both ovaries are visualized and are normal. There is no adnexal mass or pelvic free  fluid. IMPRESSION: 1. Live twin intrauterine pregnancy. There is again mildly asymmetric crown-rump length and gestational age, although there has been interval fetal growth of both fetus A and B from prior exam. 2. Large subchorionic hemorrhage, possibly slightly diminished in size from prior. 3. There is mild myometrial thinning overlying the gestational sac for twin B, which appears similar to prior. Recommend attention at follow-up. Electronically Signed   By: Keith Rake M.D.   On: 06/14/2021 18:09    ? ? ?Review of Systems  ?Constitutional:  Negative for fever.  ?Gastrointestinal:  Positive for abdominal pain.  ?Genitourinary:  Positive for vaginal bleeding.  ?Physical Exam  ? ?Blood pressure 124/76, pulse (!) 105, temperature 98.5 ?F (36.9 ?C), temperature source Oral, resp. rate 17, last menstrual period 04/25/2021, SpO2 100 %, unknown if currently breastfeeding. ? ?Physical Exam ?Vitals and nursing note reviewed.  ?Constitutional:   ?   General: She is not in acute distress. ?   Appearance: Normal appearance. She is not ill-appearing, toxic-appearing or diaphoretic.  ?Genitourinary: ?   Comments: Cervix closed, thick posterior. Several dark red clots noted on the pad.  Anderson Malta, NP  ?Skin: ?   General: Skin is warm.  ?Neurological:  ?   Mental Status: She is alert and oriented to person, place, and time.  ?Psychiatric:     ?   Behavior: Behavior normal.  ? ?MAU Course  ?Procedures ?None ? ?MDM ? ?A positive blood type.  ? ?Assessment and Plan  ? ?A: ? ?1. Subchorionic hematoma in first trimester, fetus 2 of multiple gestation   ?2. Vaginal bleeding during pregnancy   ?3. [redacted] weeks gestation of pregnancy   ?  ? ?P: ? ?DC home ?Bleeding precautions ?Pelvic rest ?Return to MAU if symptoms worsen ?Continue prenatal care.  ? ?Lezlie Lye, NP ?06/15/2021 ?10:17 AM ? ?

## 2021-06-16 ENCOUNTER — Telehealth: Payer: Medicaid Other

## 2021-06-22 ENCOUNTER — Telehealth: Payer: Self-pay | Admitting: Family Medicine

## 2021-06-22 NOTE — Telephone Encounter (Signed)
Patients mother came by the office requesting a form that she had dropped off from [facility] and also wanted a copy of daughter Yvonne Jenkins's recent physical and shot records. Gave her a copy of GCSS form, which had already been faxed to them, and a copy of CPE and shot records. ?

## 2021-06-22 NOTE — Telephone Encounter (Signed)
Patient was given a copy of Yvonne Jenkins and shot record for to take to school. ?

## 2021-06-24 ENCOUNTER — Telehealth: Payer: Self-pay

## 2021-06-24 ENCOUNTER — Telehealth (INDEPENDENT_AMBULATORY_CARE_PROVIDER_SITE_OTHER): Payer: Medicaid Other

## 2021-06-24 ENCOUNTER — Other Ambulatory Visit: Payer: Self-pay

## 2021-06-24 DIAGNOSIS — O30041 Twin pregnancy, dichorionic/diamniotic, first trimester: Secondary | ICD-10-CM | POA: Insufficient documentation

## 2021-06-24 DIAGNOSIS — O418X10 Other specified disorders of amniotic fluid and membranes, first trimester, not applicable or unspecified: Secondary | ICD-10-CM

## 2021-06-24 DIAGNOSIS — O208 Other hemorrhage in early pregnancy: Secondary | ICD-10-CM

## 2021-06-24 DIAGNOSIS — O0991 Supervision of high risk pregnancy, unspecified, first trimester: Secondary | ICD-10-CM

## 2021-06-24 DIAGNOSIS — Z3A00 Weeks of gestation of pregnancy not specified: Secondary | ICD-10-CM

## 2021-06-24 DIAGNOSIS — O0990 Supervision of high risk pregnancy, unspecified, unspecified trimester: Secondary | ICD-10-CM | POA: Insufficient documentation

## 2021-06-24 HISTORY — DX: Other specified disorders of amniotic fluid and membranes, first trimester, not applicable or unspecified: O41.8X10

## 2021-06-24 HISTORY — DX: Other hemorrhage in early pregnancy: O20.8

## 2021-06-24 HISTORY — DX: Twin pregnancy, dichorionic/diamniotic, first trimester: O30.041

## 2021-06-24 MED ORDER — BLOOD PRESSURE MONITORING DEVI: 1.0000 | 0 refills | Status: DC

## 2021-06-24 NOTE — Telephone Encounter (Signed)
Called Pt to give Anatomy U/S appointment ON 08/17/21 @ 9;30a, no answer, left VM. ?

## 2021-06-24 NOTE — Progress Notes (Signed)
Pt refused to do Blood work today at her In person New OB Intake. ?

## 2021-06-24 NOTE — Patient Instructions (Signed)
AREA PEDIATRIC/FAMILY PRACTICE PHYSICIANS ? ?[place] ([address]) ?[facility] ?Chambliss, MD; Eniola, MD; Hale, MD; Hensel, MD; McDiarmid, MD; McIntyer, MD; Grisel Blumenstock, MD; Walden, MD ?[address] [place], [place] [address] ?[phone] ?Mon-Fri 8:30-12:30, 1:30-5:00 ?Providers come to see babies at [facility] ?Accepting Medicaid ?[facility] ?Limited providers who accept newborns: Koirala, MD; Morrow, MD; Wolters, MD ?[address], [place], [place] [address] ?[phone] ?Mon-Fri 8:00-5:30 ?Babies seen by providers at [facility] ?Does NOT accept Medicaid ?Please call early in hospitalization for appointment (limited availability)  ?[facility] ?Mulberry, MD ?[address] [place], [place] [address] ?[phone] ?Mon, Tue, Thur, Fri 8:30-5:00, Wed 10:00-7:00 (closed 1-2pm) ?Babies seen by [facility] providers ?Accepting Medicaid ?[facility] ?Rubin, MD ?[address], [place], [place] [address] ?[phone] ?Mon-Fri 8:30-5:00, Sat 8:30-12:00 ?Provider comes to see babies at [facility] ?Accepting Medicaid ?Must have been referred from current patients or contacted office prior to delivery ?[facility] ([facility]) ?Brown, MD; Chandler, MD; Ettefagh, MD; Grant, MD; Lester, MD; McCormick, MD; McQueen, MD; Prose, MD; Simha, MD; Stanley, MD; Stryffeler, NP; Tebben, NP ?[address], [place], [place] [address] ?[phone] ?Mon, Tue, Thur, Fri 8:30-5:30, Wed 9:30-5:30, Sat 8:30-12:30 ?Babies seen by [facility] providers ?Accepting Medicaid ?Only accepting infants of first-time parents or siblings of current patients ?Hospital discharge coordinator will make follow-up appointment ?Yvonne Jenkins ?[address], [place], [place]  [address] ?[phone]   Fax - [phone] ?[facility] ?[address], [place], [place]  [address] ?Phone - [phone]   Fax - [phone] ?Yvonne Jenkins ?[address], [place], [place]  [address] ?[phone] ? ?[place] ([address]) ?[facility] ?Bates, MD; [place]field, MD; Cooper, Cox, MD; MD; Davis, MD; Dovico, MD; Ettefaugh, MD; Little, MD; Lowe, MD; Keiffer, MD; Melvin, MD; Sumner, MD; Williams, MD ?[address], [place], [place] [address] ?[phone] ?Mon-Fri 8:30-5:00 (extended evenings Mon-Thur as needed), Sat-Sun 10:00-1:00 ?Providers come to see babies at [facility] ?Accepting Medicaid for families of first-time babies and families with all children in the household age 3 and under. Must register with office prior to making appointment (M-F only). ?[facility] ?Henson, NP; Knapp, MD; Lalonde, MD; Tysinger, PA ?[address] [place], [place] [address] ?[phone] ?Mon-Fri 8:00-5:00 ?Babies seen by providers at [facility] ?Does NOT accept Medicaid/Commercial Insurance Only ?[facility] ([facility])  ?Artis, MD; Barnes, MD; Bratton, MD; Coccaro, MD; Lockett Gardner, MD; Kramer, MD; Marshall, MD; Netherton, MD; Poleto, MD; Skinner, MD ?[address] [place], [place] [address] ?[phone] ?Mon-Fri 8:30-5:30, Sat (Oct.-Mar.) 9:00-1:00 ?Babies seen by providers at [facility] ?Accepting Medicaid ? ?[place] ([address]) ?[facility] ?Reid, MD; Warner, MD ?[address], [place],  [address] ?[phone] ?Mon-Fri 8:30-5:00, Sat 8:30-12:00 ?Providers come to see babies at [facility] ?Does NOT accept Medicaid ?[facility] ?Becker, PA; Hagler, MD; Scifres, PA; Sun, MD; Swayne, MD ?[address], [place],  [address] ?[phone] ?Mon-Fri 8:00-5:00 ?Babies seen by providers at [facility] ?Does NOT accept Medicaid ?Only accepting babies of parents who  are patients ?Please call early in hospitalization for appointment (limited availability) ?[facility] ?Clark, MD; Frye, MD; Kelleher, MD; Mack, NP; Miller, MD; O'Keller, MD; Patterson, NP; Pudlo, MD; Puzio, MD; Thomas, MD; Tucker, MD; Twiselton, MD ?[address], [place], [place] [address] ?[phone] ?Mon-Fri 8:00-5:00, Sat 9:00-12:00 ?Providers come to see babies at [facility] ?Does NOT accept Medicaid ? ?[place] ([address]) ?[facility] ?Limited providers accepting new patients: Brake, NP; Wharton, PA ?[address], [place], [place] [address] ?[phone] ?Mon-Fri 8:00-5:00 ?Babies seen by providers at [facility] ?Does NOT accept Medicaid ?Only accepting babies of parents who are patients ?Please call early in hospitalization for appointment (limited availability) ?[facility] ?Gay, MD; Quinlan, MD ?[address] [place], [place] [address] ?[phone] (press [phone] to schedule appointment) ?Mon-Fri 8:00-5:00 ?Providers come to see babies at [facility] ?Does NOT accept Medicaid ?[facility] ?Mazer, MD ?[address] [place], [place] [address] ?[phone] ?Mon-Fri 8:30-5:00 (lunch 12:30-1:00), extended hours by appointment only Wed 5:00-6:30 ?Babies seen by [facility] providers ?Accepting Medicaid ?[facility] ?Banks, MD; Jordan, MD; Koberlein, MD ?[address], [place], [place] [address] ?[phone] ?Mon-Fri 8:00-5:00 ?Babies seen by [facility] providers ?Does NOT accept Medicaid ?[facility] ?Parker, MD; Hunter, MD; Wallace, DO ?[address] [place], [place] [address] ?[phone] ?Mon-Fri 8:00-5:00 ?Babies seen by [facility] providers ?Does NOT accept Medicaid ?[facility] ?Brandon, PA; Brecken, PA; Christy, NP; Dees, MD; DeClaire, MD; DeWeese, MD; Hansen, NP; Mills, NP; Parrish, NP; Smoot, NP; Summer, MD; Vapne,  MD ?[address] [place], [place] [address] ?[phone] ?Mon-Fri 8:30-5:00, Sat 10:00-1:00 ?Providers come to see babies at [facility] ?Does NOT accept Medicaid ?Free prenatal information session Tuesdays at 4:45pm ?[facility] ?Bouska, MD; Gordon, PA; Jeffery, PA; Weber, PA ?[address] [place] [address] ?[phone] ?Mon-Fri 7:30-5:30 ?Babies seen by [facility] providers ?[facility] ?[address], [place], [place]  [address] ?[phone]   Fax - [phone] ? ?[place] ([address]) ?[facility] ?Reese, MD ?[address] [place], [place] [address] ?[phone] ?Mon-Thur 8:00-6:00 ?Providers come to see babies at [facility] ?Accepting Medicaid ?[facility] ?Anderson, NP; Badger, MD; Beal, PA; Spencer, PA ?[address] [place], [place] [address] ?[phone] ?Mon-Thur 7:30-7:30, Fri 7:30-4:30 ?Babies seen by [facility] providers ?Accepting Medicaid ?[facility] ?Agbuya, MD; Klett, NP; Romgoolam, MD ?[address], [place], [place] [address] ?[phone] ?Mon-Fri 8:30-5:00, Sat 8:30-12:00 ?Providers come to see babies at [facility] ?Accepting Medicaid ?Must have ?Meet & Greet? appointment at office prior to delivery ?[facility] ([facility]) ?McCord, MD; Wallace, MD; Wood, MD ?[address], [place], [place] [address] ?[phone] ?Mon-Wed 8:00-6:00, Thur-Fri 8:00-5:00, Sat 9:00-12:00 ?Providers come to see babies at [facility] ?Does NOT accept Medicaid ?Only accepting siblings of current patients ?[facility]  ?[address], [place], [place]  [address] ?[phone]   Fax - [phone] ?[facility] ?[address], [place], [place]  [address] ?[phone]   Fax -  [phone] ? ?[place] ([address]) ?[facility] ?Cirigliano, DO; Matthews, DO ?[address] [place], [place] [address] ?[phone] ?Mon-Fri 7:00-5:00 ?Babies seen by [facility]

## 2021-06-24 NOTE — Progress Notes (Signed)
New OB Intake ? ?I connected with  Yvonne Jenkins on 06/24/21 at  9:15 AM EDT by In Person Visit and verified that I am speaking with the correct person using two identifiers. Nurse is located at [facility] and pt is located at [facility]. ? ?I discussed the limitations, risks, security and privacy concerns of performing an evaluation and management service by telephone and the availability of in person appointments. I also discussed with the patient that there may be a patient responsible charge related to this service. The patient expressed understanding and agreed to proceed. ? ?I explained I am completing New OB Intake today. We discussed her EDD of 01/09/22 that is based on U/S on 05/27/21. Pt is G6/P5. I reviewed her allergies, medications, Medical/Surgical/OB history, and appropriate screenings. I informed her of [facility] services. Based on history, this is a/an  pregnancy complicated by Twins  .  ? ?There are no problems to display for this patient. ? ? ?Concerns addressed today ? ?Delivery Plans:  ?Plans to deliver at [facility]  ? ?MyChart/Babyscripts ?MyChart access verified. I explained pt will have some visits in office and some virtually. Babyscripts instructions given and order placed. Patient verifies receipt of registration text/e-mail. Account successfully created and app downloaded. ? ?Blood Pressure Cuff  ?Blood pressure cuff ordered for patient to pick-up from [employer]. Explained after first prenatal appt pt will check weekly and document in 43. ? ?Weight scale: Patient does / does not  have weight scale. Weight scale ordered for patient to pick up from [employer].  ? ?Anatomy US ?Explained first scheduled [place] will be around 19 weeks. Anatomy US scheduled for 08/17/21 at 0930. Pt notified to arrive at 0915. ?Scheduled AFP lab only appointment if CenteringPregnancy pt for same day as anatomy US.  ? ?Labs ?Discussed Johnsie Cancel genetic screening with patient. Would like both Panorama and Horizon  drawn at new OB visit.Also if interested in genetic testing, tell patient she will need AFP 15-21 weeks to complete genetic testing .Routine prenatal labs needed. ? ?Covid Vaccine ?Patient has covid vaccine.  ? ?Is patient a CenteringPregnancy candidate? Not a candidate  "Centering Patient" indicated on sticky note ?  ?Is patient a Mom+Baby Combined Care candidate? Not a candidate   Scheduled with Mom+Baby provider  ?  ?Is patient interested in Valentine? No  "Interested in [employer] - Schedule next visit with CNM" on sticky note ? ?Informed patient of [facility] website  and placed link in her AVS.  ? ?Social Determinants of Health ?Food Insecurity: Patient denies food insecurity. ?WIC Referral: Patient is interested in referral to [facility]  ?Transportation: Patient denies transportation needs. ?Childcare: Discussed no children allowed at ultrasound appointments. Offered childcare services; patient declines childcare services at this time. ? ?Send link to Pregnancy Navigators ? ? ?Placed OB Box on problem list and updated ? ?First visit review ?I reviewed new OB appt with pt. I explained she will have a pelvic exam, ob bloodwork with genetic screening, and PAP smear. Explained pt will be seen by Maye Hides, CNM at first visit; encounter routed to appropriate provider. Explained that patient will be seen by pregnancy navigator following visit with provider. [facility] information placed in AVS.  ? ?Bethanne Ginger, CMA ?06/24/2021  9:27 AM  ?

## 2021-06-26 ENCOUNTER — Inpatient Hospital Stay (EMERGENCY_DEPARTMENT_HOSPITAL)
Admission: AD | Admit: 2021-06-26 | Discharge: 2021-06-26 | Disposition: A | Discharge status: Home / Self Care | Payer: Medicaid Other | Source: Home / Self Care | Attending: Obstetrics and Gynecology | Admitting: Obstetrics and Gynecology

## 2021-06-26 ENCOUNTER — Ambulatory Visit (HOSPITAL_BASED_OUTPATIENT_CLINIC_OR_DEPARTMENT_OTHER): Payer: Medicaid Other | Admitting: Anesthesiology

## 2021-06-26 ENCOUNTER — Ambulatory Visit (HOSPITAL_COMMUNITY)
Admission: RE | Admit: 2021-06-26 | Discharge: 2021-06-26 | Disposition: A | Discharge status: Home / Self Care | Payer: Medicaid Other | Attending: Obstetrics and Gynecology | Admitting: Obstetrics and Gynecology

## 2021-06-26 ENCOUNTER — Encounter (HOSPITAL_COMMUNITY): Payer: Self-pay | Admitting: Obstetrics and Gynecology

## 2021-06-26 ENCOUNTER — Encounter (HOSPITAL_COMMUNITY)
Admission: RE | Disposition: A | Discharge status: Home / Self Care | Payer: Self-pay | Source: Home / Self Care | Attending: Obstetrics and Gynecology

## 2021-06-26 ENCOUNTER — Telehealth: Payer: Self-pay | Admitting: Obstetrics and Gynecology

## 2021-06-26 ENCOUNTER — Ambulatory Visit (HOSPITAL_COMMUNITY): Payer: Medicaid Other | Admitting: Anesthesiology

## 2021-06-26 ENCOUNTER — Other Ambulatory Visit: Payer: Self-pay

## 2021-06-26 DIAGNOSIS — O418X10 Other specified disorders of amniotic fluid and membranes, first trimester, not applicable or unspecified: Secondary | ICD-10-CM

## 2021-06-26 DIAGNOSIS — O30041 Twin pregnancy, dichorionic/diamniotic, first trimester: Secondary | ICD-10-CM | POA: Diagnosis not present

## 2021-06-26 DIAGNOSIS — D509 Iron deficiency anemia, unspecified: Secondary | ICD-10-CM | POA: Insufficient documentation

## 2021-06-26 DIAGNOSIS — D508 Other iron deficiency anemias: Secondary | ICD-10-CM

## 2021-06-26 DIAGNOSIS — O036 Delayed or excessive hemorrhage following complete or unspecified spontaneous abortion: Secondary | ICD-10-CM | POA: Insufficient documentation

## 2021-06-26 DIAGNOSIS — O039 Complete or unspecified spontaneous abortion without complication: Secondary | ICD-10-CM | POA: Diagnosis not present

## 2021-06-26 DIAGNOSIS — O034 Incomplete spontaneous abortion without complication: Secondary | ICD-10-CM | POA: Diagnosis present

## 2021-06-26 DIAGNOSIS — O021 Missed abortion: Secondary | ICD-10-CM | POA: Insufficient documentation

## 2021-06-26 DIAGNOSIS — O209 Hemorrhage in early pregnancy, unspecified: Secondary | ICD-10-CM

## 2021-06-26 DIAGNOSIS — J45909 Unspecified asthma, uncomplicated: Secondary | ICD-10-CM | POA: Diagnosis not present

## 2021-06-26 DIAGNOSIS — Z3A00 Weeks of gestation of pregnancy not specified: Secondary | ICD-10-CM | POA: Insufficient documentation

## 2021-06-26 DIAGNOSIS — O0990 Supervision of high risk pregnancy, unspecified, unspecified trimester: Secondary | ICD-10-CM

## 2021-06-26 DIAGNOSIS — Z3A11 11 weeks gestation of pregnancy: Secondary | ICD-10-CM | POA: Diagnosis not present

## 2021-06-26 DIAGNOSIS — F17210 Nicotine dependence, cigarettes, uncomplicated: Secondary | ICD-10-CM | POA: Diagnosis not present

## 2021-06-26 DIAGNOSIS — O30009 Twin pregnancy, unspecified number of placenta and unspecified number of amniotic sacs, unspecified trimester: Secondary | ICD-10-CM | POA: Insufficient documentation

## 2021-06-26 HISTORY — PX: DILATION AND EVACUATION: SHX1459

## 2021-06-26 HISTORY — DX: Incomplete spontaneous abortion without complication: O03.4

## 2021-06-26 LAB — CBC WITH DIFFERENTIAL/PLATELET
Abs Immature Granulocytes: 0.03 10*3/uL (ref 0.00–0.07)
Basophils Absolute: 0.1 10*3/uL (ref 0.0–0.1)
Basophils Relative: 1 %
Eosinophils Absolute: 0.2 10*3/uL (ref 0.0–0.5)
Eosinophils Relative: 2 %
HCT: 25.7 % — ABNORMAL LOW (ref 36.0–46.0)
Hemoglobin: 8.5 g/dL — ABNORMAL LOW (ref 12.0–15.0)
Immature Granulocytes: 0 %
Lymphocytes Relative: 10 %
Lymphs Abs: 1.2 10*3/uL (ref 0.7–4.0)
MCH: 25.1 pg — ABNORMAL LOW (ref 26.0–34.0)
MCHC: 33.1 g/dL (ref 30.0–36.0)
MCV: 76 fL — ABNORMAL LOW (ref 80.0–100.0)
Monocytes Absolute: 0.7 10*3/uL (ref 0.1–1.0)
Monocytes Relative: 6 %
Neutro Abs: 9.8 10*3/uL — ABNORMAL HIGH (ref 1.7–7.7)
Neutrophils Relative %: 81 %
Platelets: 230 10*3/uL (ref 150–400)
RBC: 3.38 MIL/uL — ABNORMAL LOW (ref 3.87–5.11)
RDW: 13.7 % (ref 11.5–15.5)
WBC: 12.1 10*3/uL — ABNORMAL HIGH (ref 4.0–10.5)
nRBC: 0 % (ref 0.0–0.2)

## 2021-06-26 LAB — TYPE AND SCREEN
ABO/RH(D): A POS
Antibody Screen: NEGATIVE

## 2021-06-26 LAB — HCG, QUANTITATIVE, PREGNANCY: hCG, Beta Chain, Quant, S: 165738 m[IU]/mL — ABNORMAL HIGH (ref <5)

## 2021-06-26 SURGERY — DILATION AND EVACUATION, UTERUS
Anesthesia: General

## 2021-06-26 MED ORDER — DEXAMETHASONE SODIUM PHOSPHATE 10 MG/ML IJ SOLN
INTRAMUSCULAR | Status: DC | PRN
  Administered 2021-06-26: 10 mg via INTRAVENOUS

## 2021-06-26 MED ORDER — PROPOFOL 10 MG/ML IV BOLUS
INTRAVENOUS | Status: DC | PRN
  Administered 2021-06-26: 200 mg via INTRAVENOUS

## 2021-06-26 MED ORDER — OXYCODONE HCL 5 MG/5ML PO SOLN: 5.0000 mg | Freq: Once | ORAL | Status: DC | PRN

## 2021-06-26 MED ORDER — ACETAMINOPHEN 500 MG PO TABS
1000.0000 mg | ORAL_TABLET | ORAL | Status: AC
  Administered 2021-06-26: 1000 mg via ORAL
  Filled 2021-06-26: qty 2

## 2021-06-26 MED ORDER — RHO D IMMUNE GLOBULIN 1500 UNIT/2ML IJ SOSY
300.0000 ug | PREFILLED_SYRINGE | Freq: Once | INTRAMUSCULAR | Status: DC
  Filled 2021-06-26: qty 2

## 2021-06-26 MED ORDER — MIDAZOLAM HCL 2 MG/2ML IJ SOLN
INTRAMUSCULAR | Status: AC
  Filled 2021-06-26: qty 2

## 2021-06-26 MED ORDER — LACTATED RINGERS IV BOLUS
1000.0000 mL | Freq: Once | INTRAVENOUS | Status: AC
  Administered 2021-06-26: 1000 mL via INTRAVENOUS

## 2021-06-26 MED ORDER — FENTANYL CITRATE (PF) 250 MCG/5ML IJ SOLN
INTRAMUSCULAR | Status: AC
  Filled 2021-06-26: qty 5

## 2021-06-26 MED ORDER — LIDOCAINE 2% (20 MG/ML) 5 ML SYRINGE
INTRAMUSCULAR | Status: AC
  Filled 2021-06-26: qty 5

## 2021-06-26 MED ORDER — CHLORHEXIDINE GLUCONATE 0.12 % MT SOLN
OROMUCOSAL | Status: AC
  Filled 2021-06-26: qty 15

## 2021-06-26 MED ORDER — DEXAMETHASONE SODIUM PHOSPHATE 10 MG/ML IJ SOLN
INTRAMUSCULAR | Status: AC
  Filled 2021-06-26: qty 1

## 2021-06-26 MED ORDER — ORAL CARE MOUTH RINSE
15.0000 mL | Freq: Once | OROMUCOSAL | Status: DC
Start: 2021-06-26 — End: 2021-06-27

## 2021-06-26 MED ORDER — LACTATED RINGERS IV SOLN: INTRAVENOUS | Status: DC

## 2021-06-26 MED ORDER — IBUPROFEN 800 MG PO TABS: 800.0000 mg | ORAL_TABLET | Freq: Three times a day (TID) | ORAL | 1 refills | Status: DC | PRN

## 2021-06-26 MED ORDER — DEXTROSE 5 % IV SOLN
200.0000 mg | INTRAVENOUS | Status: AC
  Administered 2021-06-26: 200 mg via INTRAVENOUS
  Filled 2021-06-26: qty 200

## 2021-06-26 MED ORDER — ONDANSETRON HCL 4 MG/2ML IJ SOLN
INTRAMUSCULAR | Status: DC | PRN
  Administered 2021-06-26: 4 mg via INTRAVENOUS

## 2021-06-26 MED ORDER — MIDAZOLAM HCL 2 MG/2ML IJ SOLN: 0.5000 mg | Freq: Once | INTRAMUSCULAR | Status: DC | PRN

## 2021-06-26 MED ORDER — MEPERIDINE HCL 25 MG/ML IJ SOLN: 6.2500 mg | INTRAMUSCULAR | Status: DC | PRN

## 2021-06-26 MED ORDER — LIDOCAINE-EPINEPHRINE 1 %-1:100000 IJ SOLN
INTRAMUSCULAR | Status: AC
  Filled 2021-06-26: qty 1

## 2021-06-26 MED ORDER — MISOPROSTOL 200 MCG PO TABS
600.0000 ug | ORAL_TABLET | Freq: Once | ORAL | Status: AC
  Administered 2021-06-26: 600 ug via ORAL
  Filled 2021-06-26: qty 3

## 2021-06-26 MED ORDER — OXYCODONE HCL 5 MG PO TABS: 5.0000 mg | ORAL_TABLET | Freq: Once | ORAL | Status: DC | PRN

## 2021-06-26 MED ORDER — FERROUS SULFATE 325 (65 FE) MG PO TABS: 325.0000 mg | ORAL_TABLET | ORAL | 0 refills | Status: DC

## 2021-06-26 MED ORDER — LIDOCAINE 2% (20 MG/ML) 5 ML SYRINGE
INTRAMUSCULAR | Status: DC | PRN
  Administered 2021-06-26: 40 mg via INTRAVENOUS

## 2021-06-26 MED ORDER — MIDAZOLAM HCL 2 MG/2ML IJ SOLN
INTRAMUSCULAR | Status: DC | PRN
  Administered 2021-06-26: 2 mg via INTRAVENOUS

## 2021-06-26 MED ORDER — 0.9 % SODIUM CHLORIDE (POUR BTL) OPTIME
TOPICAL | Status: DC | PRN
  Administered 2021-06-26: 1000 mL

## 2021-06-26 MED ORDER — FENTANYL CITRATE (PF) 250 MCG/5ML IJ SOLN
INTRAMUSCULAR | Status: DC | PRN
  Administered 2021-06-26: 50 ug via INTRAVENOUS

## 2021-06-26 MED ORDER — POVIDONE-IODINE 10 % EX SWAB
2.0000 "application " | Freq: Once | CUTANEOUS | Status: AC
  Administered 2021-06-26: 2 via TOPICAL

## 2021-06-26 MED ORDER — CHLORHEXIDINE GLUCONATE 0.12 % MT SOLN
15.0000 mL | Freq: Once | OROMUCOSAL | Status: DC
Start: 2021-06-26 — End: 2021-06-27

## 2021-06-26 MED ORDER — ONDANSETRON HCL 4 MG/2ML IJ SOLN
INTRAMUSCULAR | Status: AC
  Filled 2021-06-26: qty 2

## 2021-06-26 MED ORDER — KETOROLAC TROMETHAMINE 15 MG/ML IJ SOLN
15.0000 mg | INTRAMUSCULAR | Status: AC
  Administered 2021-06-26: 15 mg via INTRAVENOUS
  Filled 2021-06-26: qty 1

## 2021-06-26 MED ORDER — HYDROMORPHONE HCL 1 MG/ML IJ SOLN: 0.2500 mg | INTRAMUSCULAR | Status: DC | PRN

## 2021-06-26 SURGICAL SUPPLY — 22 items
CATH ROBINSON RED A/P 16FR (CATHETERS) ×2 IMPLANT
DECANTER SPIKE VIAL GLASS SM (MISCELLANEOUS) ×2 IMPLANT
FILTER UTR ASPR ASSEMBLY (MISCELLANEOUS) ×2 IMPLANT
GAUZE 4X4 16PLY ~~LOC~~+RFID DBL (SPONGE) ×2 IMPLANT
GLOVE SURG ENC MOIS LTX SZ6 (GLOVE) ×2 IMPLANT
GLOVE SURG UNDER POLY LF SZ6.5 (GLOVE) ×2 IMPLANT
GLOVE SURG UNDER POLY LF SZ7 (GLOVE) ×2 IMPLANT
GOWN STRL REUS W/ TWL LRG LVL3 (GOWN DISPOSABLE) ×2 IMPLANT
GOWN STRL REUS W/TWL LRG LVL3 (GOWN DISPOSABLE) ×2
HOSE CONNECTING 18IN BERKELEY (TUBING) ×2 IMPLANT
KIT BERKELEY 1ST TRI 3/8 NO TR (MISCELLANEOUS) ×2 IMPLANT
KIT BERKELEY 1ST TRIMESTER 3/8 (MISCELLANEOUS) ×2 IMPLANT
NS IRRIG 1000ML POUR BTL (IV SOLUTION) ×2 IMPLANT
PACK VAGINAL MINOR WOMEN LF (CUSTOM PROCEDURE TRAY) ×2 IMPLANT
PAD OB MATERNITY 4.3X12.25 (PERSONAL CARE ITEMS) ×2 IMPLANT
SET BERKELEY SUCTION TUBING (SUCTIONS) ×2 IMPLANT
TOWEL GREEN STERILE FF (TOWEL DISPOSABLE) ×2 IMPLANT
UNDERPAD 30X36 HEAVY ABSORB (UNDERPADS AND DIAPERS) ×2 IMPLANT
VACURETTE 10 RIGID CVD (CANNULA) IMPLANT
VACURETTE 7MM CVD STRL WRAP (CANNULA) IMPLANT
VACURETTE 8 RIGID CVD (CANNULA) IMPLANT
VACURETTE 9 RIGID CVD (CANNULA) IMPLANT

## 2021-06-26 NOTE — H&P (Signed)
Yvonne Jenkins is an 31 y.o. female. [birth year] at 11wks who experienced SAB of both twins today. She had ongoing vaginal bleeding during observation in the MAU and given her continued heavy bleeding and concern for RPOC, I recommended surgical management. She had received misoprostol 800 buccally without improvement in her bleeding.  ? ?She desired surgical management but declined MVA. She also was unable to go from MAU to OR due to childcare issues. She stated she could go home and come right back once her kids were home.  ? ?Pertinent Gynecological History: ?OB History: G6, P5  ? ?Menstrual History: ?Patient's last menstrual period was 04/25/2021 (approximate). ?  ? ?Past Medical History:  ?Diagnosis Date  ? Asthma   ? ? ?No past surgical history on file. ? ?No family history on file. ? ?Social History:  reports that she has been smoking cigarettes. She has been smoking an average of .25 packs per day. She has never used smokeless tobacco. She reports that she does not drink alcohol and does not use drugs. ? ?Allergies: No Known Allergies ? ?No medications prior to admission.  ? ? ?Review of Systems  ?Constitutional: Negative.   ?HENT: Negative.    ?Eyes: Negative.   ?Respiratory: Negative.    ?Cardiovascular: Negative.   ?Gastrointestinal: Negative.   ?Endocrine: Negative.   ?Genitourinary:  Positive for vaginal bleeding.  ?Musculoskeletal: Negative.   ?Skin: Negative.   ?Allergic/Immunologic: Negative.   ?Neurological: Negative.   ?Hematological: Negative.   ?Psychiatric/Behavioral: Negative.    ? ?Last menstrual period 04/25/2021, unknown if currently breastfeeding. ?Physical Exam ?Constitutional:   ?   Appearance: She is obese.  ?HENT:  ?   Head: Normocephalic and atraumatic.  ?   Nose: Nose normal.  ?   Mouth/Throat:  ?   Mouth: Mucous membranes are moist.  ?   Pharynx: Oropharynx is clear.  ?Eyes:  ?   Extraocular Movements: Extraocular movements intact.  ?   Conjunctiva/sclera: Conjunctivae normal.  ?   Pupils:  Pupils are equal, round, and reactive to light.  ?Cardiovascular:  ?   Rate and Rhythm: Normal rate.  ?   Pulses: Normal pulses.  ?   Heart sounds: Normal heart sounds.  ?Pulmonary:  ?   Effort: Pulmonary effort is normal.  ?   Breath sounds: Normal breath sounds.  ?Abdominal:  ?   General: Abdomen is flat. Bowel sounds are normal.  ?   Palpations: Abdomen is soft.  ?Genitourinary: ?   General: Normal vulva.  ?   Comments: Vaginal vault filling slowly with bright red bleeding; clots and bleeding cleared and slowly fills again ?Musculoskeletal:     ?   General: Normal range of motion.  ?   Cervical back: Normal range of motion and neck supple.  ?Skin: ?   General: Skin is warm.  ?   Capillary Refill: Capillary refill takes less than 2 seconds.  ?Neurological:  ?   General: No focal deficit present.  ?   Mental Status: She is alert and oriented to person, place, and time. Mental status is at baseline.  ?Psychiatric:     ?   Mood and Affect: Mood normal.     ?   Behavior: Behavior normal.     ?   Thought Content: Thought content normal.     ?   Judgment: Judgment normal.  ? ? ?Results for orders placed or performed during the hospital encounter of 06/26/21 (from the past 24 hour(s))  ?CBC with Differential/Platelet  Status: Abnormal  ? Collection Time: 06/26/21  9:51 AM  ?Result Value Ref Range  ? WBC 12.1 (H) 4.0 - 10.5 K/uL  ? RBC 3.38 (L) 3.87 - 5.11 MIL/uL  ? Hemoglobin 8.5 (L) 12.0 - 15.0 g/dL  ? HCT 25.7 (L) 36.0 - 46.0 %  ? MCV 76.0 (L) 80.0 - 100.0 fL  ? MCH 25.1 (L) 26.0 - 34.0 pg  ? MCHC 33.1 30.0 - 36.0 g/dL  ? RDW 13.7 11.5 - 15.5 %  ? Platelets 230 150 - 400 K/uL  ? nRBC 0.0 0.0 - 0.2 %  ? Neutrophils Relative % 81 %  ? Neutro Abs 9.8 (H) 1.7 - 7.7 K/uL  ? Lymphocytes Relative 10 %  ? Lymphs Abs 1.2 0.7 - 4.0 K/uL  ? Monocytes Relative 6 %  ? Monocytes Absolute 0.7 0.1 - 1.0 K/uL  ? Eosinophils Relative 2 %  ? Eosinophils Absolute 0.2 0.0 - 0.5 K/uL  ? Basophils Relative 1 %  ? Basophils Absolute 0.1  0.0 - 0.1 K/uL  ? Immature Granulocytes 0 %  ? Abs Immature Granulocytes 0.03 0.00 - 0.07 K/uL  ?hCG, quantitative, pregnancy     Status: Abnormal  ? Collection Time: 06/26/21  9:51 AM  ?Result Value Ref Range  ? hCG, Beta Chain, Quant, S 165,738 (H) <5 mIU/mL  ? ? ?No results found. ? ?Assessment/Plan: ?Retained products of conception ?- Plan is for D&E ?- Doxycycline preop ordered.  ?- Risks of surgery include but are not limited to: bleeding, infection, injury to surrounding organs/tissues (i.e. bowel/bladder/ureters), need for additional procedures, wound complications, hospital re-admission, and conversion to open surgery, additional complications: Uterine perforation ?- We discussed postop restrictions, precautions and expectations ?- She is Rh positive ?- Fetal tissue sent for anora.  ?- Preop testing needed:  T&S ? ? ?Radene Gunning ?06/26/2021, 1:58 PM ? ?

## 2021-06-26 NOTE — Discharge Instructions (Addendum)
PLEASE RETURN AS SOON AS POSSIBLE FOR YOUR SURGERY.  ? ?[facility]  ?[address] [place]  ?Main entrance  ? ?Do NOT drive if you feel like you are lightheaded, dizzy, or going to pass out.  ?

## 2021-06-26 NOTE — Transfer of Care (Signed)
Immediate Anesthesia Transfer of Care Note ? ?Patient: Yvonne Jenkins ? ?Procedure(s) Performed: DILATATION AND EVACUATION ? ?Patient Location: PACU ? ?Anesthesia Type:General ? ?Level of Consciousness: drowsy ? ?Airway & Oxygen Therapy: Patient Spontanous Breathing and Patient connected to face mask oxygen ? ?Post-op Assessment: Report given to RN and Post -op Vital signs reviewed and stable ? ?Post vital signs: Reviewed and stable ? ?Last Vitals:  ?Vitals Value Taken Time  ?BP 105/63 06/26/21 1721  ?Temp    ?Pulse 78 06/26/21 1724  ?Resp 14 06/26/21 1724  ?SpO2 100 % 06/26/21 1724  ?Vitals shown include unvalidated device data. ? ?Last Pain:  ?Vitals:  ? 06/26/21 1602  ?TempSrc: Oral  ?   ? ?  ? ?Complications: No notable events documented. ?

## 2021-06-26 NOTE — Anesthesia Procedure Notes (Signed)
Procedure Name: LMA Insertion ?Date/Time: 06/26/2021 4:57 PM ?Performed by: Lorie Phenix, CRNA ?Pre-anesthesia Checklist: Patient identified, Emergency Drugs available, Suction available and Patient being monitored ?Patient Re-evaluated:Patient Re-evaluated prior to induction ?Oxygen Delivery Method: Circle System Utilized ?Preoxygenation: Pre-oxygenation with 100% oxygen ?Induction Type: IV induction ?Ventilation: Mask ventilation without difficulty ?LMA: LMA inserted ?LMA Size: 4.0 ?Number of attempts: 1 ?Placement Confirmation: positive ETCO2 ?Tube secured with: Tape ?Dental Injury: Teeth and Oropharynx as per pre-operative assessment  ? ? ? ? ?

## 2021-06-26 NOTE — MAU Provider Note (Signed)
?History  ?  ? ?CSN: [id number] ? ?Arrival date and time: 06/26/21 [id number] ? ? Event Date/Time  ? First Provider Initiated Contact with Patient 06/26/21 0957   ?  ? ?Chief Complaint  ?Patient presents with  ? Vaginal Bleeding  ? ?Yvonne Jenkins is a 31 year old female [id number] presenting for evaluation of vaginal bleeding and suspected spontaneous miscarriage.  This pregnancy is a di/di twin gestation with a known large subchorionic hemorrhage of approximately 7 cm. ? ?Reports earlier this morning around 830 she developed a large leak of watery fluid following the pressure and subsequent delivery of a 11 week fetus.  She has been having vaginal bleeding since 3/5, but noticed more clots today.  She brought the specimen for evaluation.  She is still having some cramping but reports it is manageable.  Feels like there is something still left in her vaginal vault.  ? ?She denies feeling lightheaded, dizzy, short of breath, fatigued, or chest pain. ? ?Past Medical History:  ?Diagnosis Date  ? Asthma   ? ? ?History reviewed. No pertinent surgical history. ? ?History reviewed. No pertinent family history. ? ?Social History  ? ?Tobacco Use  ? Smoking status: Every Day  ?  Packs/day: 0.25  ?  Types: Cigarettes  ? Smokeless tobacco: Never  ?Substance Use Topics  ? Alcohol use: No  ? Drug use: No  ? ? ?Allergies: No Known Allergies ? ?Medications Prior to Admission  ?Medication Sig Dispense Refill Last Dose  ? Blood Pressure Monitoring DEVI 1 each by Does not apply route once a week. 1 each 0   ? metroNIDAZOLE (FLAGYL) 500 MG tablet Take 1 tablet (500 mg total) by mouth 2 (two) times daily. (Patient not taking: Reported on 06/24/2021) 14 tablet 0   ? ondansetron (ZOFRAN) 8 MG tablet Take 1 tablet (8 mg total) by mouth every 8 (eight) hours as needed for nausea or vomiting. 20 tablet 0   ? ? ?Review of Systems  ?Constitutional:  Negative for fatigue and fever.  ?Eyes:  Negative for visual disturbance.  ?Respiratory:  Negative for chest  tightness and shortness of breath.   ?Cardiovascular:  Negative for leg swelling.  ?Gastrointestinal:  Positive for abdominal pain. Negative for constipation, diarrhea, nausea and vomiting.  ?Genitourinary:  Positive for vaginal bleeding. Negative for dysuria and vaginal discharge.  ?Skin:  Negative for pallor.  ?Neurological:  Negative for dizziness, weakness, light-headedness and headaches.  ?Psychiatric/Behavioral:  Negative for behavioral problems.   ?Physical Exam  ? ?Blood pressure 125/80, pulse (!) 102, temperature 98.6 ?F (37 ?C), temperature source Oral, resp. rate 16, height 5\' 8"  (1.727 m), weight 83.8 kg, last menstrual period 04/25/2021, SpO2 100 %, unknown if currently breastfeeding. ? ?Physical Exam ?Constitutional:   ?   General: She is not in acute distress. ?   Appearance: Normal appearance. She is not ill-appearing.  ?HENT:  ?   Head: Normocephalic and atraumatic.  ?   Nose: Nose normal.  ?   Mouth/Throat:  ?   Mouth: Mucous membranes are moist.  ?Eyes:  ?   Extraocular Movements: Extraocular movements intact.  ?Cardiovascular:  ?   Pulses: Normal pulses.  ?   Comments: Slightly tachycardic  ?Pulmonary:  ?   Effort: Pulmonary effort is normal.  ?Abdominal:  ?   General: There is no distension.  ?   Palpations: Abdomen is soft.  ?   Tenderness: There is no abdominal tenderness. There is no guarding or rebound.  ?Genitourinary: ?   Comments: Pelvic  exam: VULVA: normal appearing vulva with no masses, tenderness or lesions,  ? ?Upon entry to the introitus with speculum, encountered fetus surrounded by several red blood clots and few small chunks of what appeared to be placental tissue. Removed POC with ring forceps. Cleared vaginal canal of clots/bleeding with fox swabs. Cervical os is open.  ?Skin: ?   General: Skin is warm.  ?Neurological:  ?   General: No focal deficit present.  ?   Mental Status: She is alert and oriented to person, place, and time.  ?Psychiatric:     ?   Behavior: Behavior  normal.  ? ? ? ? ? ? ?MAU Course  ? ?MDM ?CBC ?B-hcg quant ordered prior to triage   ?A positive blood type  ? ?Discussed presentation and findings with Dr. Damita Dunnings.  Recommended offering IV Venofer and Cytotec.  Discussed with patient, she would like to try Cytotec however is not interested in IV Venofer at this time.  Reports she needs to leave by 1:30 PM to pick up her son.  Cytotec given. ? ?Reassessed 1225: Hemoglobin 8.5, 10.4 one month ago.  Patient reports she is asymptomatic.  Vital stable. Re-performed speculum exam, immediately encountered the vaginal canal full of clots.  Once evacuated with ring forceps and swabs, slowly would reaccumulate within the canal.  Dr. Damita Dunnings made aware, plans to proceed with D&C.  Patient reports she must pick up her son first and then can return for procedure. ? ?Assessment and Plan  ? ?1 Spontaneous miscarriage ?2. Vaginal bleeding affecting early pregnancy ?Both fetuses passed and pictured above. Sending for [facility] genetic screening per patient preference. Suspected pieces of retained placental tissue with generous amount of vaginal bleeding still present. Planning for D&C this afternoon.  ? ?3. Other iron deficiency anemia ?Hgb 8.5 but asymptomatic.  Offered IV Venofer, but declined.  Rx'd ferrous sulfate to start every other day. ? ?Provided comfort and support.  Patient discharged in stable condition with the understanding that she will return shortly for procedure or sooner if needed. ?  ? ?Yvonne Jenkins ?06/26/2021 ?

## 2021-06-26 NOTE — Telephone Encounter (Signed)
Called patient at 3pm to confirm she was coming for procedure. She reported she was coming back for her procedure. She sounded like she was in the car. OR notified.  ? ?Radene Gunning, MD ?Attending [place], Faculty Practice ?[facility] [place] ? ? ?

## 2021-06-26 NOTE — Progress Notes (Signed)
Patient here with ongoing bleeding clinically that would suggest RPOC following SAB at 11 wks of didi twins s/p misoprostol.  Her vital signs are stable. Her hgb was 8.5.  ? ?I recommended surgical management - offered MVA in triage vs D&E in the OR. She does not want MVA - she would not want to be awake. She cannot stay for surgery right now - she has to pick up her children. She states she just has to quickly get her kids home and then she will come right back. Preferred would be that she stay, but acceptable for her to go home given her VS but she understands to minimize delay to returning to the hospital.  ? ?I cannot add her on for surgery until she is actually discharged. Once discharged, I will call OR and have her added on. Pt will remain NPO.  ? ?Radene Gunning, MD ?Attending [place], Faculty Practice ?[facility] [place] ? ? ?

## 2021-06-26 NOTE — MAU Note (Signed)
Yvonne Jenkins is a 31 y.o. at [redacted]w[redacted]d here in MAU reporting: miscarriage & VB.  Reports pregnant with twins, had VB & cramping this morning went to restroom and passed one embryo.  States feels as if second twin is in vagina. ? ?Onset of complaint: today ?Pain score: 0 ?There were no vitals filed for this visit.   ?FHT: deferred ?Lab orders placed from triage:   None ?

## 2021-06-26 NOTE — Anesthesia Preprocedure Evaluation (Addendum)
Anesthesia Evaluation  ?Patient identified by MRN, date of birth, ID band ?Patient awake ? ? ? ?Reviewed: ?Allergy & Precautions, NPO status , Patient's Chart, lab work & pertinent test results ? ?History of Anesthesia Complications ?Negative for: history of anesthetic complications ? ?Airway ?Mallampati: I ? ?TM Distance: >3 FB ?Neck ROM: Full ? ? ? Dental ? ?(+) Dental Advisory Given ?  ?Pulmonary ?Current Smoker and Patient abstained from smoking.,  ?  ?breath sounds clear to auscultation ? ? ? ? ? ? Cardiovascular ?negative cardio ROS ? ? ?Rhythm:Regular Rate:Normal ? ? ?  ?Neuro/Psych ?negative neurological ROS ?   ? GI/Hepatic ?negative GI ROS, Neg liver ROS,   ?Endo/Other  ?negative endocrine ROS ? Renal/GU ?negative Renal ROS  ? ?  ?Musculoskeletal ? ? Abdominal ?  ?Peds ? Hematology ? ?(+) Blood dyscrasia (Hb 8.5), anemia ,   ?Anesthesia Other Findings ? ? Reproductive/Obstetrics ?Retained products of conception after miscarriage ? ?  ? ? ? ? ? ? ? ? ? ? ? ? ? ?  ?  ? ? ? ? ? ? ? ?Anesthesia Physical ?Anesthesia Plan ? ?ASA: 2 ? ?Anesthesia Plan: General  ? ?Post-op Pain Management: Tylenol PO (pre-op)*  ? ?Induction: Intravenous ? ?PONV Risk Score and Plan: 2 and Ondansetron, Dexamethasone and Treatment may vary due to age or medical condition ? ?Airway Management Planned: LMA ? ?Additional Equipment: None ? ?Intra-op Plan:  ? ?Post-operative Plan:  ? ?Informed Consent: I have reviewed the patients History and Physical, chart, labs and discussed the procedure including the risks, benefits and alternatives for the proposed anesthesia with the patient or authorized representative who has indicated his/her understanding and acceptance.  ? ? ? ?Dental advisory given ? ?Plan Discussed with: CRNA and Surgeon ? ?Anesthesia Plan Comments:   ? ? ? ? ? ?Anesthesia Quick Evaluation ? ?

## 2021-06-26 NOTE — Op Note (Signed)
06/26/21 ? ?Preoperative Diagnosis: Missed abortion ?Postoperative Diagnosis: Same ?Procedure: Dilation and evacuation ?Surgeon: Dr. Para March ?Assist: None ?Anesthesia: LMA ?EBL 25 cc ?IVF: 400 cc  ?UOP: not drained ?Complications: None ? ?Findings: NEFG, normal appearing cervix, Normal anteverted uterus,  products of conception noted on pad after vaginal prep and some at internal os. ? ?Description of the procedure: ?Informed consent reviewed and signed. Pt given opportunity to ask questions.  ? ?Pt prepped and draped in the dorsal lithotomy fashion after LMA anesthesia found to be adequate. Timeout performed. Doxycycline given.  ? ?Open-sided speculum placed into the vagina. Cervix grasped with a single tooth tenaculum. Cervix already dilated to a 39 pratt.  I used a 12 mm rigid curette. Several passes done with the suction curette and the tissue was retrieved. A gentle pass was done with a 1 curette. A gritty texture noted in all areas of the uterus confirming complete evacuation. Minimal bleeding noted. Procedure completed. All instruments removed. Counts correct x2.  ? ?Products of conception sent to pathology. Genetics done on the fetal tissue from MAU already. Rh Positive - rhogam not indicated ? ?Pt taken to recovery room in stable condition. ? ?

## 2021-06-26 NOTE — Anesthesia Postprocedure Evaluation (Signed)
Anesthesia Post Note ? ?Patient: Alijah Hyde ? ?Procedure(s) Performed: DILATATION AND EVACUATION ? ?  ? ?Patient location during evaluation: PACU ?Anesthesia Type: General ?Level of consciousness: sedated, oriented and patient cooperative ?Pain management: pain level controlled ?Vital Signs Assessment: post-procedure vital signs reviewed and stable ?Respiratory status: spontaneous breathing, nonlabored ventilation and respiratory function stable ?Cardiovascular status: blood pressure returned to baseline and stable ?Postop Assessment: no apparent nausea or vomiting ?Anesthetic complications: no ? ? ?No notable events documented. ? ?Last Vitals:  ?Vitals:  ? 06/26/21 1721 06/26/21 1735  ?BP: 105/63 117/74  ?Pulse: 78 90  ?Resp: 15 13  ?Temp: 37.7 ?C   ?SpO2: 100% 100%  ?  ?Last Pain:  ?Vitals:  ? 06/26/21 1735  ?TempSrc:   ?PainSc: 0-No pain  ? ? ?  ?  ?  ?  ?  ?  ? ?Naoko Diperna,E. Lakeitha Basques ? ? ? ? ?

## 2021-06-27 ENCOUNTER — Encounter (HOSPITAL_COMMUNITY): Payer: Self-pay | Admitting: Obstetrics and Gynecology

## 2021-06-30 LAB — SURGICAL PATHOLOGY

## 2021-07-02 ENCOUNTER — Encounter: Payer: Medicaid Other | Admitting: Student

## 2021-07-27 ENCOUNTER — Ambulatory Visit: Payer: Medicaid Other | Admitting: Obstetrics & Gynecology

## 2021-08-17 ENCOUNTER — Ambulatory Visit: Payer: Medicaid Other

## 2021-08-17 ENCOUNTER — Other Ambulatory Visit: Payer: Self-pay

## 2021-10-20 ENCOUNTER — Encounter: Payer: Self-pay | Admitting: Family Medicine

## 2021-10-20 ENCOUNTER — Ambulatory Visit: Payer: Medicaid Other | Admitting: Family Medicine

## 2021-10-20 VITALS — BP 118/78 | HR 82 | Temp 98.1°F | Resp 16 | Wt 189.0 lb

## 2021-10-20 DIAGNOSIS — J4520 Mild intermittent asthma, uncomplicated: Secondary | ICD-10-CM

## 2021-10-20 DIAGNOSIS — A6000 Herpesviral infection of urogenital system, unspecified: Secondary | ICD-10-CM | POA: Diagnosis not present

## 2021-10-20 DIAGNOSIS — Z30011 Encounter for initial prescription of contraceptive pills: Secondary | ICD-10-CM

## 2021-10-20 MED ORDER — ALBUTEROL SULFATE HFA 108 (90 BASE) MCG/ACT IN AERS: 2.0000 | INHALATION_SPRAY | Freq: Four times a day (QID) | RESPIRATORY_TRACT | 0 refills | Status: DC | PRN

## 2021-10-20 MED ORDER — NORGESTIMATE-ETH ESTRADIOL 0.25-35 MG-MCG PO TABS: 1.0000 | ORAL_TABLET | Freq: Every day | ORAL | 3 refills | Status: DC

## 2021-10-20 MED ORDER — VALACYCLOVIR HCL 1 G PO TABS: 1000.0000 mg | ORAL_TABLET | Freq: Two times a day (BID) | ORAL | 0 refills | Status: AC

## 2021-10-20 MED ORDER — CETIRIZINE HCL 10 MG PO TABS: 10.0000 mg | ORAL_TABLET | Freq: Every day | ORAL | 3 refills | Status: DC

## 2021-10-20 NOTE — Progress Notes (Unsigned)
3 month f/u  Patient is doing well today with no concerns

## 2021-10-21 ENCOUNTER — Encounter: Payer: Self-pay | Admitting: Family Medicine

## 2021-10-21 NOTE — Progress Notes (Signed)
Established Patient Office Visit  Subjective    Patient ID: Yvonne Jenkins, female    DOB: 1990-08-20  Age: 31 y.o. MRN: [id number]  CC: No chief complaint on file.   HPI Yvonne Jenkins presents for wanting birth control after recent spontaneous ab of twins. She denies any abnormal vaginal bleeding. Patient also complains of recurrent herpes infection. She has taken valtrex in the pst   Outpatient Encounter Medications as of 10/20/2021  Medication Sig   albuterol (VENTOLIN HFA) 108 (90 Base) MCG/ACT inhaler Inhale 2 puffs into the lungs every 6 (six) hours as needed for wheezing or shortness of breath.   norgestimate-ethinyl estradiol (ORTHO-CYCLEN) 0.25-35 MG-MCG tablet Take 1 tablet by mouth daily.   valACYclovir (VALTREX) 1000 MG tablet Take 1 tablet (1,000 mg total) by mouth 2 (two) times daily for 10 days.   cetirizine (ZYRTEC) 10 MG tablet Take 1 tablet (10 mg total) by mouth daily.   [DISCONTINUED] cetirizine (ZYRTEC) 10 MG tablet Take 10 mg by mouth daily.   [DISCONTINUED] ferrous sulfate 325 (65 FE) MG tablet Take 1 tablet (325 mg total) by mouth every other day for 30 doses.   [DISCONTINUED] ibuprofen (ADVIL) 800 MG tablet Take 1 tablet (800 mg total) by mouth 3 (three) times daily with meals as needed for headache, moderate pain or cramping. (Patient not taking: Reported on 10/20/2021)   No facility-administered encounter medications on file as of 10/20/2021.    Past Medical History:  Diagnosis Date   Asthma     Past Surgical History:  Procedure Laterality Date   DILATION AND EVACUATION N/A 06/26/2021   Procedure: DILATATION AND EVACUATION;  Surgeon: Milas Hock, MD;  Location: [facility] OR;  Service: Gynecology;  Laterality: N/A;    No family history on file.  Social History   Socioeconomic History   Marital status: Single    Spouse name: Not on file   Number of children: Not on file   Years of education: Not on file   Highest education level: Not on file   Occupational History   Not on file  Tobacco Use   Smoking status: Every Day    Packs/day: 0.25    Types: Cigarettes   Smokeless tobacco: Never  Substance and Sexual Activity   Alcohol use: No   Drug use: No   Sexual activity: Yes    Birth control/protection: None  Other Topics Concern   Not on file  Social History Narrative   Not on file   Social Determinants of Health   Financial Resource Strain: Not on file  Food Insecurity: Not on file  Transportation Needs: Not on file  Physical Activity: Not on file  Stress: Not on file  Social Connections: Not on file  Intimate Partner Violence: Not on file    Review of Systems  All other systems reviewed and are negative.       Objective    BP 118/78   Pulse 82   Temp 98.1 F (36.7 C) (Oral)   Resp 16   Wt 189 lb (85.7 kg)   LMP 04/25/2021 (Approximate)   SpO2 98%   Breastfeeding Unknown   BMI 28.74 kg/m   Physical Exam Vitals and nursing note reviewed.  Constitutional:      General: She is not in acute distress. Cardiovascular:     Rate and Rhythm: Normal rate and regular rhythm.  Pulmonary:     Effort: Pulmonary effort is normal.     Breath sounds: Normal breath sounds.  Abdominal:  Palpations: Abdomen is soft.     Tenderness: There is no abdominal tenderness.  Neurological:     General: No focal deficit present.     Mental Status: She is alert and oriented to person, place, and time.         Assessment & Plan:   1. Encounter for initial prescription of contraceptive pills OCP prescribed  2. Mild intermittent reactive airway disease without complication Appears stable. Albuterol MDI and zyrtec refilled  3. Genital herpes simplex, unspecified site Valtrex refilled for outbreak.    Return in about 6 months (around 04/22/2022) for follow up.   Tommie Raymond, MD

## 2021-11-17 NOTE — Progress Notes (Signed)
Patient ID: Baudelia Schroepfer, female    DOB: [birth year]  MRN: [id number]  CC: Pregnancy Test   Subjective: Yvonne Jenkins is a 31 y.o. female who presents for pregnancy test.   Her concerns today include:  Requesting pregnancy test. LMP 10/25/21. Two positive at home test completed.   Patient Active Problem List   Diagnosis Date Noted   Genital herpes simplex 10/20/2021   Retained products of conception after miscarriage 06/26/2021   Supervision of high risk pregnancy, antepartum 06/24/2021   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 06/24/2021   Subchorionic hemorrhage in first trimester 06/24/2021     Current Outpatient Medications on File Prior to Visit  Medication Sig Dispense Refill   albuterol (VENTOLIN HFA) 108 (90 Base) MCG/ACT inhaler Inhale 2 puffs into the lungs every 6 (six) hours as needed for wheezing or shortness of breath. 8 g 0   cetirizine (ZYRTEC) 10 MG tablet Take 1 tablet (10 mg total) by mouth daily. 30 tablet 3   norgestimate-ethinyl estradiol (ORTHO-CYCLEN) 0.25-35 MG-MCG tablet Take 1 tablet by mouth daily. 84 tablet 3   valACYclovir (VALTREX) 1000 MG tablet Take 1,000 mg by mouth 2 (two) times daily.     No current facility-administered medications on file prior to visit.    No Known Allergies  Social History   Socioeconomic History   Marital status: Single    Spouse name: Not on file   Number of children: Not on file   Years of education: Not on file   Highest education level: Not on file  Occupational History   Not on file  Tobacco Use   Smoking status: Every Day    Packs/day: 0.25    Types: Cigarettes    Passive exposure: Current   Smokeless tobacco: Never  Substance and Sexual Activity   Alcohol use: No   Drug use: No   Sexual activity: Yes    Birth control/protection: None  Other Topics Concern   Not on file  Social History Narrative   Not on file   Social Determinants of Health   Financial Resource Strain: Not  on file  Food Insecurity: Not on file  Transportation Needs: Not on file  Physical Activity: Not on file  Stress: Not on file  Social Connections: Not on file  Intimate Partner Violence: Not on file    No family history on file.  Past Surgical History:  Procedure Laterality Date   DILATION AND EVACUATION N/A 06/26/2021   Procedure: DILATATION AND EVACUATION;  Surgeon: Milas Hock, MD;  Location: [facility] OR;  Service: Gynecology;  Laterality: N/A;    ROS: Review of Systems Negative except as stated above  PHYSICAL EXAM: BP 118/83 (BP Location: Left Arm, Patient Position: Sitting, Cuff Size: Normal)   Pulse 94   Temp 98.3 F (36.8 C)   Resp 16   Ht 5' 7.52" (1.715 m)   Wt 193 lb (87.5 kg)   LMP 04/25/2021 (Approximate)   SpO2 95%   Breastfeeding No   BMI 29.76 kg/m   Physical Exam HENT:     Head: Normocephalic and atraumatic.  Eyes:     Extraocular Movements: Extraocular movements intact.     Conjunctiva/sclera: Conjunctivae normal.     Pupils: Pupils are equal, round, and reactive to light.  Cardiovascular:     Rate and Rhythm: Normal rate and regular rhythm.     Pulses: Normal pulses.     Heart sounds: Normal heart sounds.  Pulmonary:     Effort:  Pulmonary effort is normal.     Breath sounds: Normal breath sounds.  Musculoskeletal:     Cervical back: Normal range of motion and neck supple.  Neurological:     General: No focal deficit present.     Mental Status: She is alert and oriented to person, place, and time.  Psychiatric:        Mood and Affect: Mood normal.        Behavior: Behavior normal.    Results for orders placed or performed in visit on 11/20/21  POCT urine pregnancy  Result Value Ref Range   Preg Test, Ur Positive (A) Negative  POCT URINALYSIS DIP (CLINITEK)  Result Value Ref Range   Color, UA yellow yellow   Clarity, UA clear clear   Glucose, UA negative negative mg/dL   Bilirubin, UA negative negative   Ketones, POC UA negative  negative mg/dL   Spec Grav, UA >=8.119 (A) 1.010 - 1.025   Blood, UA trace-intact (A) negative   pH, UA 6.5 5.0 - 8.0   POC PROTEIN,UA trace negative, trace   Urobilinogen, UA 0.2 0.2 or 1.0 E.U./dL   Nitrite, UA Negative Negative   Leukocytes, UA Small (1+) (A) Negative     ASSESSMENT AND PLAN: 1. Positive pregnancy test - Urine pregnancy positive. Will confirm with hCG serum.  - No urinary tract infection.  - Referral to Obstetrics Gynecology for further evaluation and management. - hCG, serum, qualitative - POCT urine pregnancy - POCT URINALYSIS DIP (CLINITEK) - Ambulatory referral to Obstetrics / Gynecology     Patient was given the opportunity to ask questions.  Patient verbalized understanding of the plan and was able to repeat key elements of the plan. Patient was given clear instructions to go to Emergency Department or return to medical center if symptoms don't improve, worsen, or new problems develop.The patient verbalized understanding.   Orders Placed This Encounter  Procedures   hCG, serum, qualitative   POCT urine pregnancy   POCT URINALYSIS DIP (CLINITEK)    Follow-up with primary provider as scheduled.  Rema Fendt, NP

## 2021-11-20 ENCOUNTER — Encounter: Payer: Self-pay | Admitting: Family

## 2021-11-20 ENCOUNTER — Ambulatory Visit: Payer: Medicaid Other | Admitting: Family

## 2021-11-20 VITALS — BP 118/83 | HR 94 | Temp 98.3°F | Resp 16 | Ht 67.52 in | Wt 193.0 lb

## 2021-11-20 DIAGNOSIS — Z3200 Encounter for pregnancy test, result unknown: Secondary | ICD-10-CM

## 2021-11-20 DIAGNOSIS — Z3201 Encounter for pregnancy test, result positive: Secondary | ICD-10-CM | POA: Diagnosis not present

## 2021-11-20 LAB — POCT URINALYSIS DIP (CLINITEK)
Bilirubin, UA: NEGATIVE
Glucose, UA: NEGATIVE mg/dL
Ketones, POC UA: NEGATIVE mg/dL
Nitrite, UA: NEGATIVE
Spec Grav, UA: >=1.03 — AB (ref 1.010–1.025)
Urobilinogen, UA: 0.2 E.U./dL
pH, UA: 6.5 (ref 5.0–8.0)

## 2021-11-20 LAB — POCT URINE PREGNANCY: Preg Test, Ur: POSITIVE — AB

## 2021-11-20 NOTE — Patient Instructions (Signed)
Human Chorionic Gonadotropin Test Why am I having this test? A human chorionic gonadotropin (hCG) test is done to determine whether you are pregnant. It can also be used: To diagnose an abnormal pregnancy. To determine whether you have had a miscarriage or are at risk of one. What is being tested? This test checks the level of the human chorionic gonadotropin (hCG) hormone in the blood. This hormone is produced during pregnancy by the cells that form the placenta. The placenta is the organ that grows inside your uterus to nourish a developing baby. When you are pregnant, hCG can be detected in your blood or urine 7 to 8 days before your missed period. The amount of hCG continues to increase for the first 8-10 weeks of pregnancy. The presence of hCG in your blood can be measured with different types of tests. You may have: A urine test. A urine test only shows whether there is hCG in your urine. It does not measure how much. A qualitative blood test. This blood test only shows whether there is hCG in your blood. It does not measure how much. A quantitative blood test. This type of blood test measures the amount of hCG in your blood. You may have this test to: Diagnose an abnormal pregnancy. Check whether you have had a miscarriage. Determine whether you are at risk of a miscarriage. Determine if treatment of an ectopic pregnancy is successful. What kind of sample is taken?     Two kinds of samples may be collected to test for the hCG hormone. Blood. It is usually collected by inserting a needle into a blood vessel. Urine. It is usually collected by urinating into a germ-free (sterile) specimen cup. How do I prepare for this test? No preparation is needed for a blood test.  Some preparation is needed for a urine test: For best results, collect the sample the first time you urinate in the morning. That is when the concentration of hCG is highest. Do not drink too much fluid. Drink as you  normally would, or as directed by your health care provider. Tell a health care provider about: All medicines you are taking, including vitamins, herbs, eye drops, creams, and over-the-counter medicines. Any blood in your urine. This may interfere with the result. How are the results reported? Depending on the type of test that you have, your test results may be reported as values. Your health care provider will compare your results to normal ranges that were established after testing a large group of people (reference ranges). Reference ranges may vary among labs and hospitals. For this test, common reference ranges that show absence of pregnancy are: Quantitative hCG blood levels: less than 5 IU/L. Other results will be reported as either positive or negative. For this test, normal results (meaning the absence of pregnancy) are: Negative for hCG in the urine test. Negative for hCG in the qualitative blood test. What do the results mean? Urine and qualitative blood test A negative result could mean: That you are not pregnant. That the test was done too early in your pregnancy to detect hCG in your blood or urine. If you still have other signs of pregnancy, the test will be repeated. A positive result means: That you are most likely pregnant. Your health care provider may confirm your pregnancy with an ultrasound of your uterus, if needed. Quantitative blood test Results of the quantitative hCG blood test will be reported as values. These values will be interpreted by your health care  provider along with your medical history and symptoms you are experiencing. Results outside of expected ranges could mean that: You are pregnant with twins. You have abnormal growths in your uterus. You have an ectopic pregnancy. You may be experiencing a miscarriage. Talk with your health care provider about what your results mean. Questions to ask your health care provider Ask your health care provider, or  the department that is doing the test: When will my results be ready? How will I get my results? What are my treatment options? What other tests do I need? What are my next steps? Summary A human chorionic gonadotropin (hCG) test is done to determine whether you are pregnant. When you are pregnant, hCG can be detected in your blood or urine 7 to 8 days before your missed period. HCG levels continue to go up for the first 8-10 weeks of pregnancy. Your hCG level can be measured with different types of tests. You may have a urine test, a qualitative blood test, or a quantitative blood test. Talk with your health care provider about what your test results mean. This information is not intended to replace advice given to you by your health care provider. Make sure you discuss any questions you have with your health care provider. Document Revised: 12/31/2019 Document Reviewed: 12/31/2019 Elsevier Patient Education  [place].

## 2021-11-20 NOTE — Progress Notes (Signed)
Pt presents for pregnancy test LMP 10/25/21, 2- positive at home test completed, pt req to complete HCG serum lab

## 2021-11-21 LAB — HCG, SERUM, QUALITATIVE: hCG,Beta Subunit,Qual,Serum: POSITIVE m[IU]/mL — AB (ref <6)

## 2021-12-02 ENCOUNTER — Encounter (HOSPITAL_COMMUNITY): Payer: Self-pay | Admitting: Obstetrics and Gynecology

## 2021-12-02 ENCOUNTER — Inpatient Hospital Stay (HOSPITAL_COMMUNITY): Payer: Medicaid Other

## 2021-12-02 ENCOUNTER — Inpatient Hospital Stay (HOSPITAL_COMMUNITY)
Admission: AD | Admit: 2021-12-02 | Discharge: 2021-12-02 | Disposition: A | Discharge status: Home / Self Care | Payer: Medicaid Other | Attending: Obstetrics and Gynecology | Admitting: Obstetrics and Gynecology

## 2021-12-02 ENCOUNTER — Other Ambulatory Visit: Payer: Self-pay

## 2021-12-02 DIAGNOSIS — R109 Unspecified abdominal pain: Secondary | ICD-10-CM | POA: Diagnosis not present

## 2021-12-02 DIAGNOSIS — Z3A01 Less than 8 weeks gestation of pregnancy: Secondary | ICD-10-CM

## 2021-12-02 DIAGNOSIS — D649 Anemia, unspecified: Secondary | ICD-10-CM | POA: Diagnosis not present

## 2021-12-02 DIAGNOSIS — Z3491 Encounter for supervision of normal pregnancy, unspecified, first trimester: Secondary | ICD-10-CM

## 2021-12-02 DIAGNOSIS — O26891 Other specified pregnancy related conditions, first trimester: Secondary | ICD-10-CM | POA: Diagnosis present

## 2021-12-02 DIAGNOSIS — O99011 Anemia complicating pregnancy, first trimester: Secondary | ICD-10-CM

## 2021-12-02 LAB — URINALYSIS, ROUTINE W REFLEX MICROSCOPIC
Bilirubin Urine: NEGATIVE
Glucose, UA: NEGATIVE mg/dL
Hgb urine dipstick: NEGATIVE
Ketones, ur: NEGATIVE mg/dL
Leukocytes,Ua: NEGATIVE
Nitrite: NEGATIVE
Protein, ur: NEGATIVE mg/dL
Specific Gravity, Urine: 1.023 (ref 1.005–1.030)
pH: 6 (ref 5.0–8.0)

## 2021-12-02 LAB — CBC
HCT: 31.8 % — ABNORMAL LOW (ref 36.0–46.0)
Hemoglobin: 9.7 g/dL — ABNORMAL LOW (ref 12.0–15.0)
MCH: 22.6 pg — ABNORMAL LOW (ref 26.0–34.0)
MCHC: 30.5 g/dL (ref 30.0–36.0)
MCV: 74.1 fL — ABNORMAL LOW (ref 80.0–100.0)
Platelets: 168 10*3/uL (ref 150–400)
RBC: 4.29 MIL/uL (ref 3.87–5.11)
RDW: 23.1 % — ABNORMAL HIGH (ref 11.5–15.5)
WBC: 6 10*3/uL (ref 4.0–10.5)
nRBC: 0 % (ref 0.0–0.2)

## 2021-12-02 LAB — WET PREP, GENITAL
Clue Cells Wet Prep HPF POC: NONE SEEN
Sperm: NONE SEEN
Trich, Wet Prep: NONE SEEN
WBC, Wet Prep HPF POC: >=10 — AB (ref <10)
Yeast Wet Prep HPF POC: NONE SEEN

## 2021-12-02 LAB — HCG, QUANTITATIVE, PREGNANCY: hCG, Beta Chain, Quant, S: 19230 m[IU]/mL — ABNORMAL HIGH (ref <5)

## 2021-12-02 NOTE — MAU Provider Note (Addendum)
History     CSN: [id number]  Arrival date and time: 12/02/21 5809   Event Date/Time   First Provider Initiated Contact with Patient 12/02/21 1038      Chief Complaint  Patient presents with   Abdominal Pain   31 y.o. [id number] @[redacted]w[redacted]d  by LMP presenting with LAP. Reports onset 2 days ago. Pain is sharp and intermittent. Only happens when she bends down and gets back up. Rates pain 3/10. Has not taken anything. Denies VB or discharge. Denies urinary sx.     OB History     Gravida  7   Para  5   Term  5   Preterm      AB  1   Living  5      SAB  1   IAB      Ectopic      Multiple      Live Births  5           Past Medical History:  Diagnosis Date   Asthma     Past Surgical History:  Procedure Laterality Date   DILATION AND EVACUATION N/A 06/26/2021   Procedure: DILATATION AND EVACUATION;  Surgeon: 06/28/2021, MD;  Location: [facility] OR;  Service: Gynecology;  Laterality: N/A;    History reviewed. No pertinent family history.  Social History   Tobacco Use   Smoking status: Former    Packs/day: 0.25    Types: Cigarettes    Passive exposure: Current   Smokeless tobacco: Never  Vaping Use   Vaping Use: Never used  Substance Use Topics   Alcohol use: No   Drug use: No    Allergies: No Known Allergies  Medications Prior to Admission  Medication Sig Dispense Refill Last Dose   albuterol (VENTOLIN HFA) 108 (90 Base) MCG/ACT inhaler Inhale 2 puffs into the lungs every 6 (six) hours as needed for wheezing or shortness of breath. 8 g 0    cetirizine (ZYRTEC) 10 MG tablet Take 1 tablet (10 mg total) by mouth daily. 30 tablet 3    norgestimate-ethinyl estradiol (ORTHO-CYCLEN) 0.25-35 MG-MCG tablet Take 1 tablet by mouth daily. 84 tablet 3    valACYclovir (VALTREX) 1000 MG tablet Take 1,000 mg by mouth 2 (two) times daily.       Review of Systems  Gastrointestinal:  Positive for abdominal pain. Negative for nausea and vomiting.  Genitourinary:   Negative for dysuria, hematuria, urgency, vaginal bleeding and vaginal discharge.   Physical Exam   Blood pressure (!) 113/57, pulse 83, temperature 98.1 F (36.7 C), temperature source Oral, resp. rate 18, height 5\' 7"  (1.702 m), weight 89.4 kg, last menstrual period 10/25/2021, SpO2 100 %.  Physical Exam Vitals and nursing note reviewed.  Constitutional:      General: She is not in acute distress.    Appearance: Normal appearance.  HENT:     Head: Normocephalic and atraumatic.  Cardiovascular:     Rate and Rhythm: Normal rate.  Pulmonary:     Effort: Pulmonary effort is normal. No respiratory distress.  Abdominal:     General: There is no distension.     Palpations: Abdomen is soft. There is no mass.     Tenderness: There is no abdominal tenderness. There is no guarding or rebound.     Hernia: No hernia is present.  Musculoskeletal:        General: Normal range of motion.     Cervical back: Normal range of motion.  Skin:  General: Skin is warm and dry.  Neurological:     General: No focal deficit present.     Mental Status: She is alert and oriented to person, place, and time.  Psychiatric:        Mood and Affect: Mood normal.        Behavior: Behavior normal.    Results for orders placed or performed during the hospital encounter of 12/02/21 (from the past 24 hour(s))  Urinalysis, Routine w reflex microscopic Urine, Clean Catch     Status: None   Collection Time: 12/02/21 10:41 AM  Result Value Ref Range   Color, Urine YELLOW YELLOW   APPearance CLEAR CLEAR   Specific Gravity, Urine 1.023 1.005 - 1.030   pH 6.0 5.0 - 8.0   Glucose, UA NEGATIVE NEGATIVE mg/dL   Hgb urine dipstick NEGATIVE NEGATIVE   Bilirubin Urine NEGATIVE NEGATIVE   Ketones, ur NEGATIVE NEGATIVE mg/dL   Protein, ur NEGATIVE NEGATIVE mg/dL   Nitrite NEGATIVE NEGATIVE   Leukocytes,Ua NEGATIVE NEGATIVE  Wet prep, genital     Status: Abnormal   Collection Time: 12/02/21 10:50 AM   Specimen:  Vaginal  Result Value Ref Range   Yeast Wet Prep HPF POC NONE SEEN NONE SEEN   Trich, Wet Prep NONE SEEN NONE SEEN   Clue Cells Wet Prep HPF POC NONE SEEN NONE SEEN   WBC, Wet Prep HPF POC >=10 (A) <10   Sperm NONE SEEN   CBC     Status: Abnormal   Collection Time: 12/02/21 10:55 AM  Result Value Ref Range   WBC 6.0 4.0 - 10.5 K/uL   RBC 4.29 3.87 - 5.11 MIL/uL   Hemoglobin 9.7 (L) 12.0 - 15.0 g/dL   HCT 60.1 (L) 09.3 - 23.5 %   MCV 74.1 (L) 80.0 - 100.0 fL   MCH 22.6 (L) 26.0 - 34.0 pg   MCHC 30.5 30.0 - 36.0 g/dL   RDW 57.3 (H) 22.0 - 25.4 %   Platelets 168 150 - 400 K/uL   nRBC 0.0 0.0 - 0.2 %   US OB LESS THAN 14 WEEKS WITH OB TRANSVAGINAL  Result Date: 12/02/2021 CLINICAL DATA:  Abdominal pain for a few days, first trimester pregnancy, LMP 10/25/2021 EXAM: OBSTETRIC <14 WK [place] AND TRANSVAGINAL OB US TECHNIQUE: Both transabdominal and transvaginal ultrasound examinations were performed for complete evaluation of the gestation as well as the maternal uterus, adnexal regions, and pelvic cul-de-sac. Transvaginal technique was performed to assess early pregnancy. COMPARISON:  None for this gestation FINDINGS: Intrauterine gestational sac: Present, single Yolk sac:  Present Embryo:  Present Cardiac Activity: Present Heart Rate: 116 bpm CRL:  2.5 mm 5 w   5 d                  [place] EDC: 07/29/2021 Subchorionic hemorrhage:  None visualized. Maternal uterus/adnexae: Uterus retroverted, otherwise unremarkable. RIGHT ovary normal size and morphology 1.9 x 4.3 x 2.0 cm. LEFT ovary normal size and morphology, 3.7 x 2.0 x 1.6 cm. Trace free pelvic fluid. No adnexal masses. IMPRESSION: Single live intrauterine gestation at 5 weeks 5 days EGA by crown-rump length. No acute abnormalities. Electronically Signed   By: Ulyses Southward M.D.   On: 12/02/2021 11:47    MAU Course  Procedures  MDM Labs and [place] ordered and reviewed. Viable IUP on [place]. Sx likely MSK, discussed comfort measures. Stable for discharge  home.   Assessment and Plan   1. [redacted] weeks gestation of pregnancy  2. Normal intrauterine pregnancy on prenatal ultrasound in first trimester   3. Anemia during pregnancy in first trimester    Discharge home Follow up at [facility] as scheduled Continue PNV w/Fe Fe supplement (has at home) Increase Fe rich foods SAB precautions  Allergies as of 12/02/2021   No Known Allergies      Medication List     STOP taking these medications    norgestimate-ethinyl estradiol 0.25-35 MG-MCG tablet Commonly known as: ORTHO-CYCLEN   valACYclovir 1000 MG tablet Commonly known as: VALTREX       TAKE these medications    albuterol 108 (90 Base) MCG/ACT inhaler Commonly known as: VENTOLIN HFA Inhale 2 puffs into the lungs every 6 (six) hours as needed for wheezing or shortness of breath.   cetirizine 10 MG tablet Commonly known as: ZYRTEC Take 1 tablet (10 mg total) by mouth daily.       Donette Larry, CNM 12/02/2021, 12:12 PM

## 2021-12-02 NOTE — Discharge Instructions (Signed)

## 2021-12-02 NOTE — MAU Note (Signed)
Yvonne Jenkins is a 31 y.o. at [redacted]w[redacted]d here in MAU reporting: intermittent sharp abdominal pain that started the past couple of days. No bleeding or abnormal discharge.  LMP: 10/25/2021  Onset of complaint: ongoing for a couple of days  Pain score: 3/10  Vitals:   12/02/21 1004  BP: 124/65  Pulse: 89  Resp: 16  Temp: 98.1 F (36.7 C)  SpO2: 100%     Lab orders placed from triage: UA

## 2021-12-03 LAB — GC/CHLAMYDIA PROBE AMP (~~LOC~~) NOT AT ARMC
Chlamydia: NEGATIVE
Comment: NEGATIVE
Comment: NORMAL
Neisseria Gonorrhea: NEGATIVE

## 2021-12-29 ENCOUNTER — Telehealth (INDEPENDENT_AMBULATORY_CARE_PROVIDER_SITE_OTHER): Payer: Medicaid Other

## 2021-12-29 DIAGNOSIS — O0990 Supervision of high risk pregnancy, unspecified, unspecified trimester: Secondary | ICD-10-CM | POA: Insufficient documentation

## 2021-12-29 DIAGNOSIS — Z3689 Encounter for other specified antenatal screening: Secondary | ICD-10-CM

## 2021-12-29 DIAGNOSIS — Z3480 Encounter for supervision of other normal pregnancy, unspecified trimester: Secondary | ICD-10-CM | POA: Insufficient documentation

## 2021-12-29 MED ORDER — BLOOD PRESSURE MONITORING DEVI: 1.0000 | 0 refills | Status: DC

## 2021-12-29 NOTE — Patient Instructions (Signed)

## 2021-12-29 NOTE — Progress Notes (Signed)
New OB Intake  I connected with  Yvonne Jenkins on 12/29/21 at  8:15 AM EDT by MyChart Video Visit and verified that I am speaking with the correct person using two identifiers. Nurse is located at [facility] and pt is located at [facility].  I discussed the limitations, risks, security and privacy concerns of performing an evaluation and management service by telephone and the availability of in person appointments. I also discussed with the patient that there may be a patient responsible charge related to this service. The patient expressed understanding and agreed to proceed.  I explained I am completing New OB Intake today. We discussed her EDD of 07/31/21 that is based on LMP of 10/25/21. Pt is G7/P5. I reviewed her allergies, medications, Medical/Surgical/OB history, and appropriate screenings. I informed her of [facility] services. [facility] information placed in AVS. Based on history, this is a/an  pregnancy uncomplicated .   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 Patient given information for [facility] Healthy Baby website for more information about Women[facility] and [place]. Patient is not interested in water birth. Offered upcoming OB visit with CNM to discuss further.  MyChart/Babyscripts MyChart access verified. I explained pt will have some visits in office and some virtually. Babyscripts instructions given and order placed. Patient verifies receipt of registration text/e-mail. Account successfully created and app downloaded.  Blood Pressure Cuff/Weight Scale Blood pressure cuff ordered for patient to pick-up from [employer].  Explained after first prenatal appt pt will check weekly and document in 33. Patient does / does not  have weight scale. Weight scale ordered for patient to pick up from [employer].   Anatomy US Explained first scheduled [place] will be around 19 weeks. Anatomy US scheduled for 03/08/22 at 0930a. Pt notified to arrive at 0915a.  Labs Discussed Johnsie Cancel genetic screening with patient. Would like both Panorama and Horizon drawn at new OB visit. Routine prenatal labs needed.  Covid Vaccine Patient has not covid vaccine.   Is patient a CenteringPregnancy candidate?    Declined due to NA Not a candidate due to OtherPt is too Masculine for Centering Centering Patient" indicated on sticky note   Is patient a Mom+Baby Combined Care candidate?  Not a candidate    Scheduled with Mom+Baby provider   Social Determinants of Health Food Insecurity: Patient denies food insecurity. WIC Referral: Patient is interested in referral to [facility].  Transportation: Patient denies transportation needs. Childcare: Discussed no children allowed at ultrasound appointments. Offered childcare services; patient declines childcare services at this time.  First visit review I reviewed new OB appt with pt. I explained she will have a provider visit that includes . Explained pt will be seen by Gerlene Fee , DO at first visit; encounter routed to appropriate provider. Explained that patient will be seen by pregnancy navigator following visit with provider.   Bethanne Ginger, [place] 12/29/2021  8:50 AM

## 2022-01-19 ENCOUNTER — Encounter: Payer: Medicaid Other | Admitting: Family Medicine

## 2022-02-10 ENCOUNTER — Emergency Department (HOSPITAL_COMMUNITY)
Admission: EM | Admit: 2022-02-10 | Discharge: 2022-02-10 | Discharge status: Against Medical Advice | Payer: Medicaid Other | Attending: Emergency Medicine | Admitting: Emergency Medicine

## 2022-02-10 ENCOUNTER — Encounter (HOSPITAL_COMMUNITY): Payer: Self-pay | Admitting: Pharmacy Technician

## 2022-02-10 ENCOUNTER — Other Ambulatory Visit: Payer: Self-pay

## 2022-02-10 ENCOUNTER — Emergency Department (HOSPITAL_COMMUNITY): Payer: Medicaid Other

## 2022-02-10 DIAGNOSIS — R059 Cough, unspecified: Secondary | ICD-10-CM | POA: Diagnosis not present

## 2022-02-10 DIAGNOSIS — O26892 Other specified pregnancy related conditions, second trimester: Secondary | ICD-10-CM | POA: Insufficient documentation

## 2022-02-10 DIAGNOSIS — O0289 Other abnormal products of conception: Secondary | ICD-10-CM | POA: Insufficient documentation

## 2022-02-10 DIAGNOSIS — R0602 Shortness of breath: Secondary | ICD-10-CM

## 2022-02-10 DIAGNOSIS — Z3A15 15 weeks gestation of pregnancy: Secondary | ICD-10-CM

## 2022-02-10 DIAGNOSIS — R0789 Other chest pain: Secondary | ICD-10-CM | POA: Diagnosis not present

## 2022-02-10 DIAGNOSIS — J45909 Unspecified asthma, uncomplicated: Secondary | ICD-10-CM | POA: Insufficient documentation

## 2022-02-10 LAB — TROPONIN I (HIGH SENSITIVITY): Troponin I (High Sensitivity): 7 ng/L (ref <18)

## 2022-02-10 LAB — CBC
HCT: 33.7 % — ABNORMAL LOW (ref 36.0–46.0)
Hemoglobin: 10.9 g/dL — ABNORMAL LOW (ref 12.0–15.0)
MCH: 25.6 pg — ABNORMAL LOW (ref 26.0–34.0)
MCHC: 32.3 g/dL (ref 30.0–36.0)
MCV: 79.1 fL — ABNORMAL LOW (ref 80.0–100.0)
Platelets: 159 10*3/uL (ref 150–400)
RBC: 4.26 MIL/uL (ref 3.87–5.11)
RDW: 18.4 % — ABNORMAL HIGH (ref 11.5–15.5)
WBC: 9 10*3/uL (ref 4.0–10.5)
nRBC: 0 % (ref 0.0–0.2)

## 2022-02-10 LAB — BASIC METABOLIC PANEL
Anion gap: 12 (ref 5–15)
BUN: 13 mg/dL (ref 6–20)
CO2: 20 mmol/L — ABNORMAL LOW (ref 22–32)
Calcium: 9.1 mg/dL (ref 8.9–10.3)
Chloride: 105 mmol/L (ref 98–111)
Creatinine, Ser: 0.71 mg/dL (ref 0.44–1.00)
GFR, Estimated: >60 mL/min (ref >60)
Glucose, Bld: 116 mg/dL — ABNORMAL HIGH (ref 70–99)
Potassium: 3.5 mmol/L (ref 3.5–5.1)
Sodium: 137 mmol/L (ref 135–145)

## 2022-02-10 LAB — I-STAT BETA HCG BLOOD, ED (MC, WL, AP ONLY): I-stat hCG, quantitative: >2000 m[IU]/mL — ABNORMAL HIGH (ref <5)

## 2022-02-10 MED ORDER — IPRATROPIUM-ALBUTEROL 0.5-2.5 (3) MG/3ML IN SOLN: 3.0000 mL | Freq: Once | RESPIRATORY_TRACT | Status: DC

## 2022-02-10 MED ORDER — ALBUTEROL SULFATE HFA 108 (90 BASE) MCG/ACT IN AERS
2.0000 | INHALATION_SPRAY | RESPIRATORY_TRACT | Status: DC | PRN
Start: 2022-02-10 — End: 2022-02-10
  Filled 2022-02-10: qty 6.7

## 2022-02-10 MED ORDER — PREDNISONE 20 MG PO TABS: ORAL_TABLET | ORAL | 0 refills | Status: DC

## 2022-02-10 MED ORDER — PREDNISONE 20 MG PO TABS: 60.0000 mg | ORAL_TABLET | Freq: Once | ORAL | Status: DC

## 2022-02-10 MED ORDER — ALBUTEROL SULFATE HFA 108 (90 BASE) MCG/ACT IN AERS: 2.0000 | INHALATION_SPRAY | RESPIRATORY_TRACT | 0 refills | Status: DC | PRN

## 2022-02-10 NOTE — Discharge Instructions (Signed)
You have been evaluated for your shortness of breath.  Unfortunately we have not fully perform all the necessary testing to assess for your shortness of breath.  Although your shortness of breath may be due to asthma exacerbation, other causes can cause shortness of breath such as a blood clot in your lungs.  You may use steroid and rescue inhaler as prescribed but if you notice no improvement please return for further assessment.  Follow-up with your gynecologist and obstetrician for further care of your pregnancy.

## 2022-02-10 NOTE — ED Triage Notes (Signed)
Pt here with reports of her chest feeling tight X2 weeks with some shob. Hx asthma and is out of prednisone and albuterol inhaler.

## 2022-02-10 NOTE — ED Provider Notes (Signed)
[facility] EMERGENCY DEPARTMENT Provider Note   CSN: [id number] Arrival date & time: 02/10/22  1509     History  Chief Complaint  Patient presents with   Chest Pain    Kalisha Keadle is a 31 y.o. female.  The history is provided by the patient and medical records. No language interpreter was used.  Chest Pain    Junell Cullifer is a 31 yo female currently [redacted]wks pregnant with a medical history of asthma who presents with chest tightness and SOB. Onset of symptoms were 2wks ago. She states it comes and goes and gets worse with exertion. Pt stated she moved from [place] about 2 years ago and was using the "blue inhaler" but once she moved to [place] they gave her the "yellow inhaler" and she says it does not work for her symptoms. Pt reports chest pain, SOB, wheezing and a dry cough. Denies fever/chills, N/V/D, recent illness, congestion.   Home Medications Prior to Admission medications   Medication Sig Start Date End Date Taking? Authorizing Provider  albuterol (VENTOLIN HFA) 108 (90 Base) MCG/ACT inhaler Inhale 2 puffs into the lungs every 6 (six) hours as needed for wheezing or shortness of breath. 10/20/21   Dorna Mai, MD  Blood Pressure Monitoring DEVI 1 each by Does not apply route once a week. 12/29/21   Autry-Lott, Naaman Plummer, DO  cetirizine (ZYRTEC) 10 MG tablet Take 1 tablet (10 mg total) by mouth daily. 10/20/21   Dorna Mai, MD  Prenatal Vit-Fe Fumarate-FA (MULTIVITAMIN-PRENATAL) 27-0.8 MG TABS tablet Take 1 tablet by mouth daily at 12 noon.    [provider]      Allergies    Patient has no known allergies.    Review of Systems   Review of Systems  Cardiovascular:  Positive for chest pain.  All other systems reviewed and are negative.   Physical Exam Updated Vital Signs BP (!) 140/74   Pulse 91   Temp 98.3 F (36.8 C) (Oral)   Resp 16   LMP 10/25/2021   SpO2 99%  Physical Exam Vitals and nursing note reviewed.  Constitutional:       General: She is not in acute distress.    Appearance: She is well-developed.  HENT:     Head: Atraumatic.  Eyes:     Conjunctiva/sclera: Conjunctivae normal.  Cardiovascular:     Rate and Rhythm: Normal rate and regular rhythm.     Pulses: Normal pulses.     Heart sounds: Normal heart sounds.  Pulmonary:     Effort: Pulmonary effort is normal.     Breath sounds: No wheezing, rhonchi or rales.  Abdominal:     Palpations: Abdomen is soft.  Musculoskeletal:     Cervical back: Neck supple.     Right lower leg: No edema.     Left lower leg: No edema.  Skin:    Findings: No rash.  Neurological:     Mental Status: She is alert.  Psychiatric:        Mood and Affect: Mood normal.     ED Results / Procedures / Treatments   Labs (all labs ordered are listed, but only abnormal results are displayed) Labs Reviewed  CBC - Abnormal; Notable for the following components:      Result Value   Hemoglobin 10.9 (*)    HCT 33.7 (*)    MCV 79.1 (*)    MCH 25.6 (*)    RDW 18.4 (*)    All other  components within normal limits  I-STAT BETA HCG BLOOD, ED (MC, [facility], AP ONLY) - Abnormal; Notable for the following components:   I-stat hCG, quantitative >2,000.0 (*)    All other components within normal limits  BASIC METABOLIC PANEL  TROPONIN I (HIGH SENSITIVITY)  TROPONIN I (HIGH SENSITIVITY)    EKG None  Date: 02/10/2022  Rate: 88  Rhythm: normal sinus rhythm  QRS Axis: normal  Intervals: normal  ST/T Wave abnormalities: normal  Conduction Disutrbances: none  Narrative Interpretation:   Old EKG Reviewed: No significant changes noted    Radiology DG Chest 1 View  Result Date: 02/10/2022 CLINICAL DATA:  Chest pain EXAM: CHEST  1 VIEW COMPARISON:  Chest x-ray 07/19/2019 FINDINGS: Artifacts overlie the chest. The heart size and mediastinal contours are within normal limits. Both lungs are clear. The visualized skeletal structures are unremarkable. IMPRESSION: No active disease.  Electronically Signed   By: Darliss Cheney M.D.   On: 02/10/2022 16:26    Procedures Procedures    Medications Ordered in ED Medications  albuterol (VENTOLIN HFA) 108 (90 Base) MCG/ACT inhaler 2 puff (has no administration in time range)    ED Course/ Medical Decision Making/ A&P                           Medical Decision Making Risk Prescription drug management.   BP 127/75 (BP Location: Right Arm)   Pulse 90   Temp 98.3 F (36.8 C) (Oral)   Resp 17   LMP 10/25/2021   SpO2 100%   5:41 PM This is a 31 year old female significant history of asthma, who is also [redacted] weeks pregnant presenting today with complaints of shortness of breath.  Patient states for the past week she has had shortness of breath and wheezing and states it is related to her asthma.  She mention she is to have a rescue inhaler with a blue top that works well but recently was given risk inhaler with the yellow top and it does not help much.  She does not use it anymore.  She is here requesting for inhaler as well as steroids as it has helped in the past.  She does not endorse any fever chills no productive cough or hemoptysis no abdominal pain no vaginal bleeding.  She also request a prescription for steroid and states she does not want to stay any longer for further work-up.  On exam this is a well-appearing female appears to be in no acute discomfort.  Heart and lung sounds normal no wheezing noted on exam.  She does not have any flulike symptoms.  -Labs ordered, independently viewed and interpreted by me.  Labs remarkable for quant HcG >2,000 consistent with pregnancy. EKG showing NSR, normal trop -EKG interpreted by me -imaging independently viewed and interpreted by me and I agree with radiologist's interpretation.  Result remarkable for normal CXR -DDx: PE, asthma exacerbation, CHF, PTX, PNA, pleurisy -treatment includes albuteriol -PCP office notes or outside notes reviewed -Discussion with attending DR.  Rancour -Escalation to admission/observation considered: pt does not want to stay for further work up.  Will leave AMA.  She understand risk of leaving, I encourage pt to stay for further work up -Prescription medication considered, patient comfortable with prednisone (potentially harmful in pregnancy) and albuterol -Social Determinant of Health considered which includes tobacco use, recommend cessation.          Final Clinical Impression(s) / ED Diagnoses Final diagnoses:  Shortness of  breath  [redacted] weeks gestation of pregnancy    Rx / DC Orders ED Discharge Orders          Ordered    predniSONE (DELTASONE) 20 MG tablet        02/10/22 1804    albuterol (VENTOLIN HFA) 108 (90 Base) MCG/ACT inhaler  Every 2 hours PRN        02/10/22 1804              Fayrene Helper, PA-C 02/10/22 1814    Glynn Octave, MD 02/10/22 2341

## 2022-02-10 NOTE — ED Provider Triage Note (Cosign Needed)
Emergency Medicine Provider Triage Evaluation Note  Yvonne Jenkins , a 31 y.o. female  was evaluated in triage.  Pt complains of chest tightness, shortness of breath.  Patient reports that she has a history of asthma, she has been out of her albuterol inhaler as well as steroids.  The patient denies any wheezing.  The patient has no active wheezing on examination.  Patient states chest pain is located centrally, does not radiate.  Patient denies any fevers or cough.  Review of Systems  Positive:  Negative:   Physical Exam  BP (!) 140/74   Pulse 91   Temp 98.3 F (36.8 C) (Oral)   Resp 16   LMP 10/25/2021   SpO2 99%  Gen:   Awake, no distress   Resp:  Normal effort  MSK:   Moves extremities without difficulty  Other:  Lung sounds clear bilaterally.  Medical Decision Making  Medically screening exam initiated at 3:53 PM.  Appropriate orders placed.  Keierra Nudo was informed that the remainder of the evaluation will be completed by another provider, this initial triage assessment does not replace that evaluation, and the importance of remaining in the ED until their evaluation is complete.     Azucena Cecil, PA-C 02/10/22 1553

## 2022-02-10 NOTE — ED Notes (Signed)
Pt left AMA.  Yvonne Jenkins maybe she would come back for scrips.  DC paperwork sent to lobby with sort techs.

## 2022-02-21 IMAGING — US US OB COMP LESS 14 WK
1 series · 15 of 28 positions shown · non-contrast
Comparison: None.

CLINICAL DATA: Bleeding for 1 week.

EXAM:
TWIN OBSTETRICAL ULTRASOUND <14 WKS
TECHNIQUE: Transabdominal ultrasound was performed for evaluation of the
gestation as well as the maternal uterus and adnexal regions.

[Series 1: us ob comp less 14 wk · 15 of 57 slices shown]
[im 1/57]
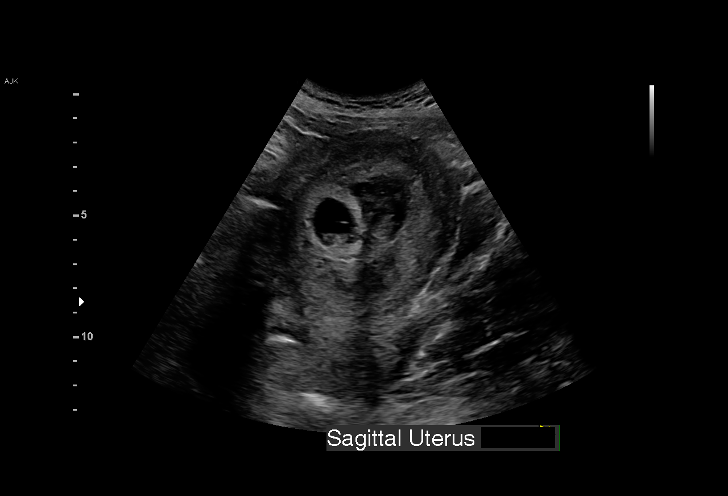
[im 5/57]
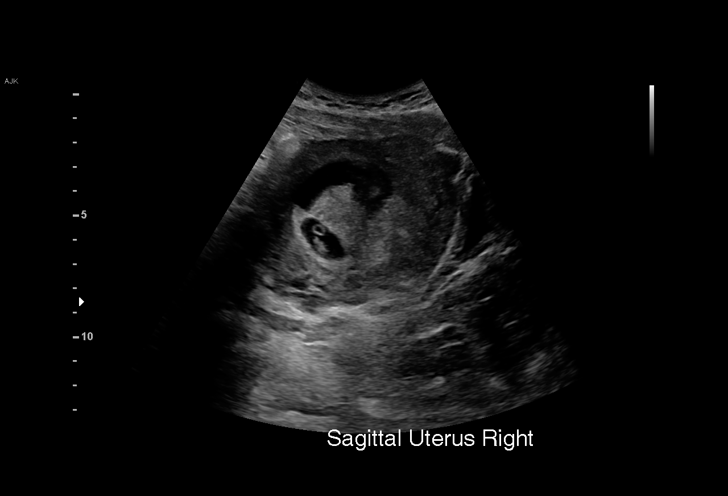
[im 9/57]
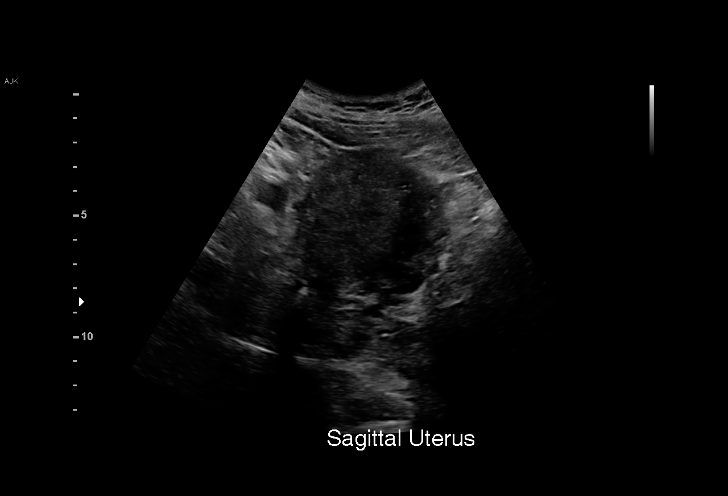
[im 13/57]
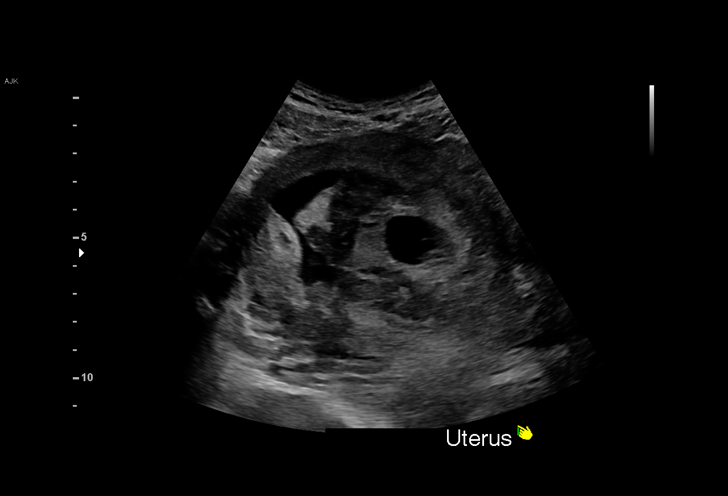
[im 17/57]
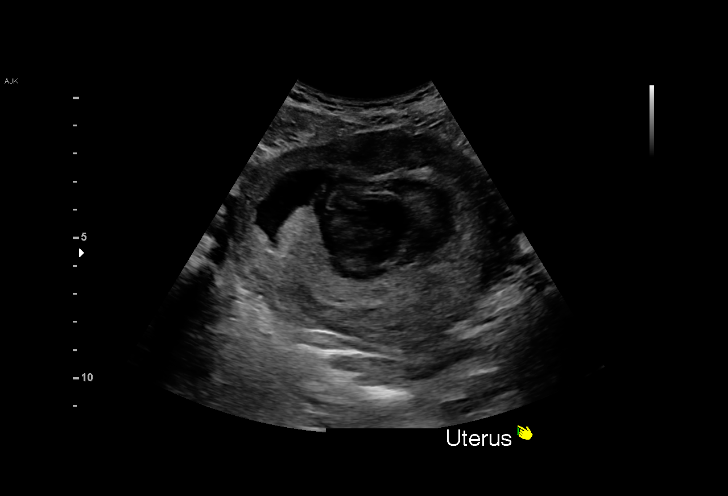
[im 21/57]
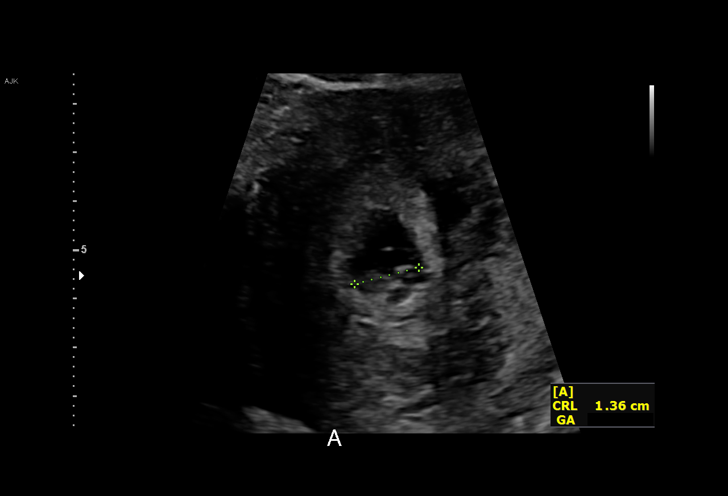
[im 25/57]
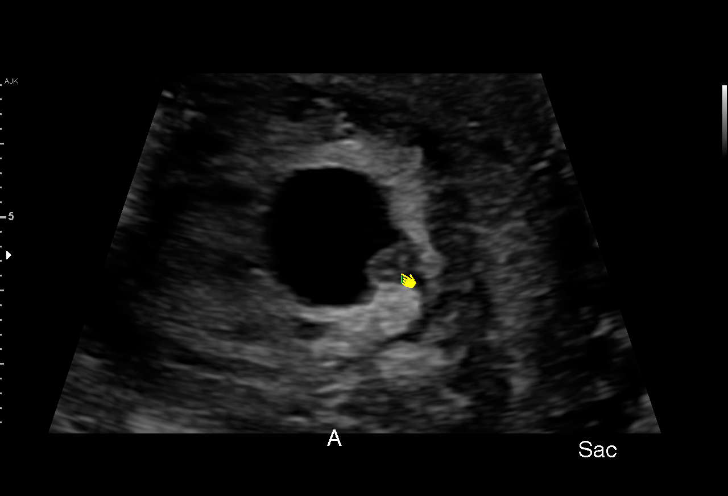
[im 30/57]
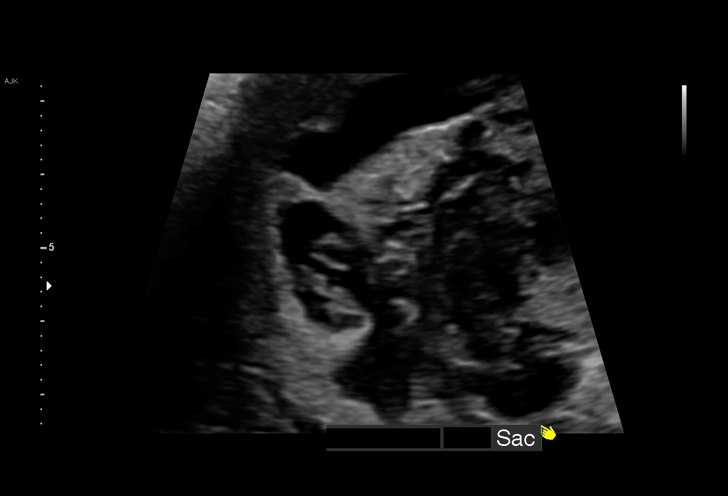
[im 32/57]
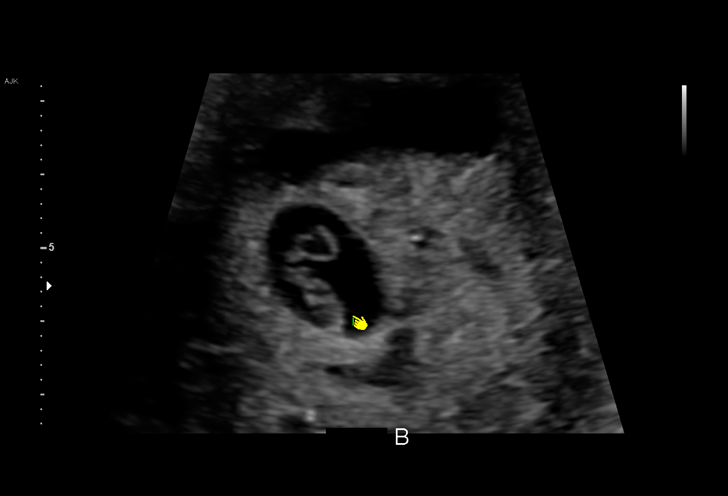
[im 36/57]
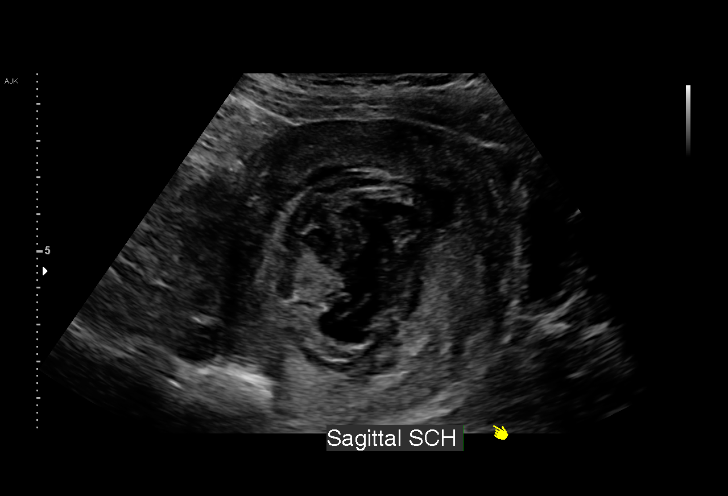
[im 40/57]
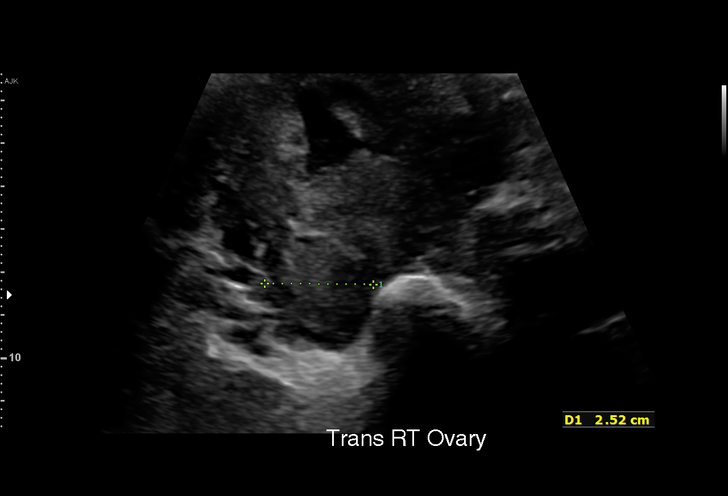
[im 44/57]
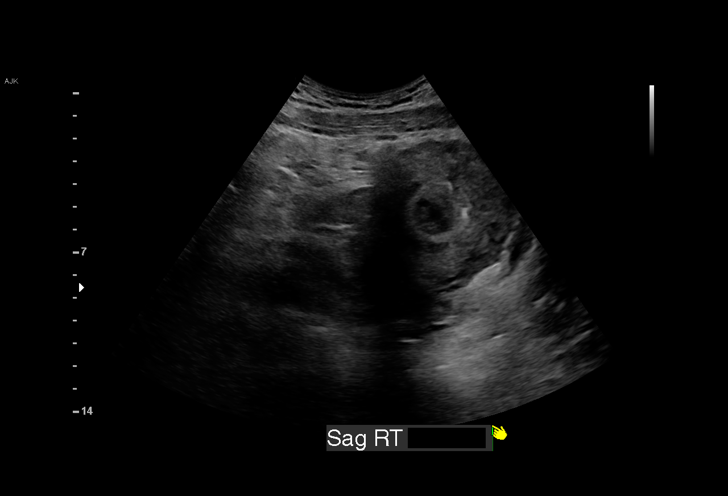
[im 48/57]
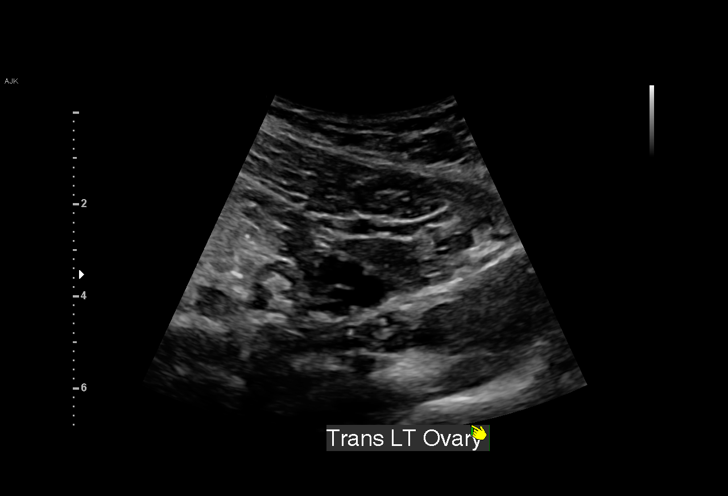
[im 52/57]
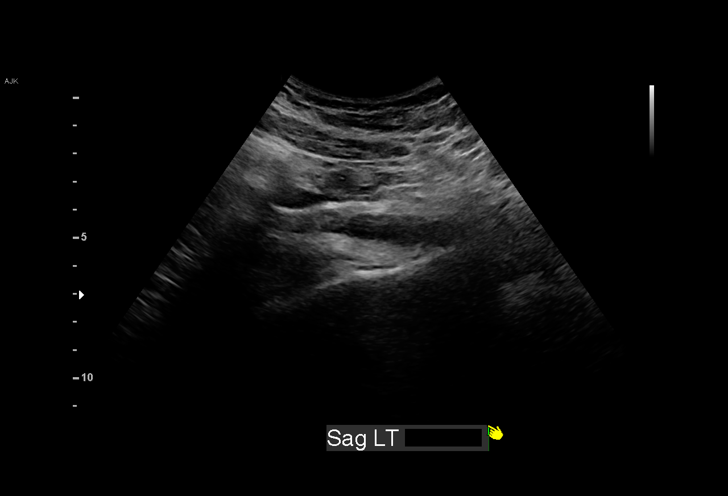
[im 57/57]
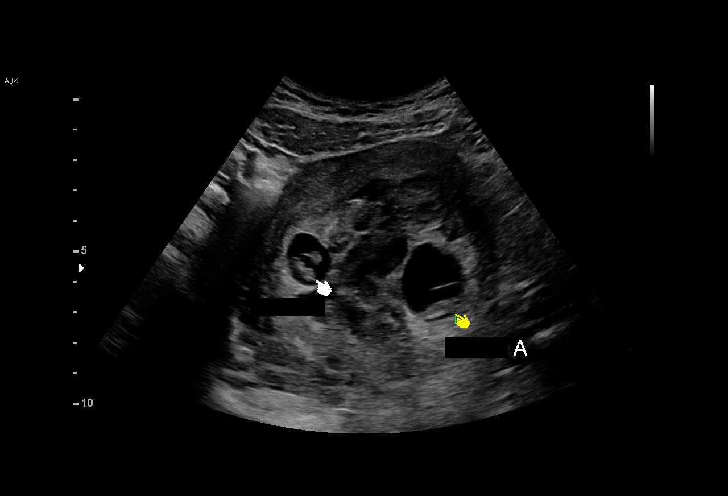

[15 of 28 positions shown; findings below may reference images not displayed]

FINDINGS: Number of IUPs:  2

Chorionicity/Amnionicity:  Dichorionic/diamniotic

TWIN 1

Yolk sac:  Yes

Embryo:  Yes

Cardiac Activity: Yes

Heart Rate: 160 bpm

CRL:   13.3 mm   7 w 4 d                  US EDC: 01/09/2022.

TWIN 2

Yolk sac:  Yes

Embryo:  Yes

Cardiac Activity: Yes

Heart Rate: 158 bpm

CRL:   12.4 mm   7 w 3 d                  US EDC: 01/10/2022.

Subchorionic hemorrhage: There is a large subchorionic hemorrhage
measuring greater than 5 cm surrounding and between the gestational
sacs.

Maternal uterus/adnexae: No free fluid in the pelvis. The ovaries
are within normal limits.
IMPRESSION: 1. Twin live intrauterine pregnancy, dichorionic/diamniotic. Twin 1
estimated gestational age is 7 weeks 4 days. Twenty-two estimated
gestational age is 7 weeks 3 days.
2. Large subchorionic hemorrhage measuring greater than 5 cm
surrounding and in between the gestational sac. OBGYN consultation
and short-term follow-up is recommended.

## 2022-02-22 ENCOUNTER — Encounter: Payer: Self-pay | Admitting: Family Medicine

## 2022-02-22 ENCOUNTER — Other Ambulatory Visit (HOSPITAL_COMMUNITY)
Admission: RE | Admit: 2022-02-22 | Discharge: 2022-02-22 | Disposition: A | Discharge status: Home / Self Care | Payer: Medicaid Other | Source: Ambulatory Visit | Attending: Family Medicine | Admitting: Family Medicine

## 2022-02-22 ENCOUNTER — Ambulatory Visit (INDEPENDENT_AMBULATORY_CARE_PROVIDER_SITE_OTHER): Payer: Medicaid Other | Admitting: Family Medicine

## 2022-02-22 VITALS — BP 134/78 | HR 93 | Wt 208.5 lb

## 2022-02-22 DIAGNOSIS — Z3A17 17 weeks gestation of pregnancy: Secondary | ICD-10-CM | POA: Diagnosis not present

## 2022-02-22 DIAGNOSIS — N841 Polyp of cervix uteri: Secondary | ICD-10-CM | POA: Diagnosis not present

## 2022-02-22 DIAGNOSIS — Z3482 Encounter for supervision of other normal pregnancy, second trimester: Secondary | ICD-10-CM

## 2022-02-22 DIAGNOSIS — O26892 Other specified pregnancy related conditions, second trimester: Secondary | ICD-10-CM | POA: Diagnosis not present

## 2022-02-22 DIAGNOSIS — O26899 Other specified pregnancy related conditions, unspecified trimester: Secondary | ICD-10-CM | POA: Diagnosis present

## 2022-02-22 DIAGNOSIS — N898 Other specified noninflammatory disorders of vagina: Secondary | ICD-10-CM | POA: Insufficient documentation

## 2022-02-22 DIAGNOSIS — N760 Acute vaginitis: Secondary | ICD-10-CM | POA: Diagnosis not present

## 2022-02-22 DIAGNOSIS — Z3480 Encounter for supervision of other normal pregnancy, unspecified trimester: Secondary | ICD-10-CM

## 2022-02-22 DIAGNOSIS — B9689 Other specified bacterial agents as the cause of diseases classified elsewhere: Secondary | ICD-10-CM

## 2022-02-22 LAB — POCT URINALYSIS DIP (DEVICE)
Bilirubin Urine: NEGATIVE
Glucose, UA: NEGATIVE mg/dL
Hgb urine dipstick: NEGATIVE
Ketones, ur: NEGATIVE mg/dL
Leukocytes,Ua: NEGATIVE
Nitrite: NEGATIVE
Protein, ur: NEGATIVE mg/dL
Specific Gravity, Urine: >=1.03 (ref 1.005–1.030)
Urobilinogen, UA: 0.2 mg/dL (ref 0.0–1.0)
pH: 5.5 (ref 5.0–8.0)

## 2022-02-22 NOTE — Progress Notes (Signed)
Home Medicaid Form complete  [facility]  02/22/22

## 2022-02-22 NOTE — Progress Notes (Signed)
PRENATAL VISIT NOTE  Subjective:  Yvonne Jenkins is a 31 y.o. [id number] at 106w1d being seen today for her first prenatal visit for this pregnancy.  She is currently monitored for the following issues for this high-risk pregnancy and has Genital herpes simplex; Supervision of other normal pregnancy, antepartum; and Cervical polyp on their problem list.  Patient reports  vaginal discharge and odor  . Contractions: Not present. Vag. Bleeding: None.  Movement: Present. Denies leaking of fluid.   She is planning to  breast and bottle . Desires POPs for contraception.   The following portions of the patient's history were reviewed and updated as appropriate: allergies, current medications, past family history, past medical history, past social history, past surgical history and problem list.   Objective:   Vitals:   02/22/22 1518  BP: 134/78  Pulse: 93  Weight: 208 lb 8 oz (94.6 kg)    Fetal Status: Fetal Heart Rate (bpm): 127   Movement: Present     General:  Alert, oriented and cooperative. Patient is in no acute distress.  Skin: Skin is warm and dry. No rash noted.   Cardiovascular: Normal heart rate and rhythm noted  Respiratory: Normal respiratory effort, no problems with respiration noted. Clear to auscultation.   Abdomen: Soft, gravid, appropriate for gestational age. Normal bowel sounds. Non-tender. Pain/Pressure: Absent     Pelvic: Cervical exam performed       Cervical polyp 1-2cm. Otherwise, normal cervical contour, no lesions, no bleeding following pap, normal discharge  Extremities: Normal range of motion.  Edema: None  Mental Status: Normal mood and affect. Normal behavior. Normal judgment and thought content.    Indications for ASA therapy (per uptodate) One of the following: Previous pregnancy with preeclampsia, especially early onset and with an adverse outcome No Multifetal gestation No Chronic hypertension No Type 1 or 2 diabetes mellitus No Chronic kidney disease  No Autoimmune disease (antiphospholipid syndrome, systemic lupus erythematosus) No  Two or more of the following: Nulliparity No Obesity (body mass index >30 kg/m2) Yes Family history of preeclampsia in mother or sister No Age ?35 years No Sociodemographic characteristics (African American race, low socioeconomic level) No Personal risk factors (eg, previous pregnancy with low birth weight or small for gestational age infant, previous adverse pregnancy outcome [eg, stillbirth], interval >10 years between pregnancies) No  Indications for early GDM screening  First-degree relative with diabetes No BMI >30kg/m2 Yes Age > 35 No Previous birth of an infant weighing ?4000 g No Gestational diabetes mellitus in a previous pregnancy No Glycated hemoglobin ?5.7 percent (39 mmol/mol), impaired glucose tolerance, or impaired fasting glucose on previous testing No High-risk race/ethnicity (eg, African American, Latino, Native American, [place] American, Pacific Islander) No Previous stillbirth of unknown cause No Maternal birthweight > 9 lbs No History of cardiovascular disease No Hypertension or on therapy for hypertension No High-density lipoprotein cholesterol level <35 mg/dL (9.38 mmol/L) and/or a triglyceride level >250 mg/dL (1.82 mmol/L) No Polycystic ovary syndrome No Physical inactivity No Other clinical condition associated with insulin resistance (eg, severe obesity, acanthosis nigricans) No Current use of glucocorticoids No   Early screening tests: FBS, A1C, Random CBG, glucose challenge  Assessment and Plan:  Pregnancy: [id number] at [redacted]w[redacted]d  1. Supervision of other normal pregnancy, antepartum - CBC/D/Plt+RPR+Rh+ABO+RubIgG... - Culture, OB Urine - Hemoglobin A1c - Cytology - PAP( [place]) - HORIZON Custom - Panorama Prenatal Test Full Panel - AFP, Serum, Open Spina Bifida - Cervicovaginal ancillary only( [place]) - POCT urinalysis dip (  device) - Interpretation: - Urine  Culture, OB Reflex - metroNIDAZOLE (FLAGYL) 500 MG tablet; Take 1 tablet (500 mg total) by mouth 2 (two) times daily for 7 days.  Dispense: 14 tablet; Refill: 0  2. Cervical polyp  3. Vaginal discharge during pregnancy in second trimester - Cervicovaginal ancillary only( [place]) - metroNIDAZOLE (FLAGYL) 500 MG tablet; Take 1 tablet (500 mg total) by mouth 2 (two) times daily for 7 days.  Dispense: 14 tablet; Refill: 0  4. BV (bacterial vaginosis) - metroNIDAZOLE (FLAGYL) 500 MG tablet; Take 1 tablet (500 mg total) by mouth 2 (two) times daily for 7 days.  Dispense: 14 tablet; Refill: 0    - no indication for ASA.  Preterm labor/first trimester warning symptoms and general obstetric precautions including but not limited to vaginal bleeding, contractions, leaking of fluid and fetal movement were reviewed in detail with the patient. Please refer to After Visit Summary for other counseling recommendations.   Discussed the normal visit cadence for prenatal care Discussed the nature of our practice with multiple providers including residents and students   Return in about 4 weeks (around 03/22/2022) for ROB.  Future Appointments  Date Time Provider Department Center  03/08/2022  9:15 AM [facility] NURSE [facility]  03/08/2022  9:30 AM [facility] US2 [facility]  03/25/2022  1:15 PM Armando Reichert CNM [facility]  04/19/2022 10:20 AM Georganna Skeans, MD [facility] None    Myrtie Hawk, DO FMOB Fellow, Faculty practice [facility] for [facility] 02/27/22  6:34 PM

## 2022-02-23 LAB — CERVICOVAGINAL ANCILLARY ONLY
Bacterial Vaginitis (gardnerella): POSITIVE — AB
Candida Glabrata: NEGATIVE
Candida Vaginitis: NEGATIVE
Comment: NEGATIVE
Comment: NEGATIVE
Comment: NEGATIVE

## 2022-02-24 ENCOUNTER — Other Ambulatory Visit (HOSPITAL_COMMUNITY): Payer: Self-pay

## 2022-02-24 LAB — CBC/D/PLT+RPR+RH+ABO+RUBIGG...
Antibody Screen: NEGATIVE
Basophils Absolute: 0 10*3/uL (ref 0.0–0.2)
Basos: 1 %
EOS (ABSOLUTE): 0.3 10*3/uL (ref 0.0–0.4)
Eos: 3 %
HCV Ab: NONREACTIVE
HIV Screen 4th Generation wRfx: NONREACTIVE
Hematocrit: 34.5 % (ref 34.0–46.6)
Hemoglobin: 11.5 g/dL (ref 11.1–15.9)
Hepatitis B Surface Ag: NEGATIVE
Immature Grans (Abs): 0.1 10*3/uL (ref 0.0–0.1)
Immature Granulocytes: 1 %
Lymphocytes Absolute: 1.4 10*3/uL (ref 0.7–3.1)
Lymphs: 16 %
MCH: 25.8 pg — ABNORMAL LOW (ref 26.6–33.0)
MCHC: 33.3 g/dL (ref 31.5–35.7)
MCV: 78 fL — ABNORMAL LOW (ref 79–97)
Monocytes Absolute: 0.5 10*3/uL (ref 0.1–0.9)
Monocytes: 6 %
Neutrophils Absolute: 6.4 10*3/uL (ref 1.4–7.0)
Neutrophils: 73 %
Platelets: 177 10*3/uL (ref 150–450)
RBC: 4.45 x10E6/uL (ref 3.77–5.28)
RDW: 16.9 % — ABNORMAL HIGH (ref 11.7–15.4)
RPR Ser Ql: NONREACTIVE
Rh Factor: POSITIVE
Rubella Antibodies, IGG: 3.66 index (ref >0.99)
WBC: 8.7 10*3/uL (ref 3.4–10.8)

## 2022-02-24 LAB — CULTURE, OB URINE

## 2022-02-24 LAB — CYTOLOGY - PAP
Chlamydia: NEGATIVE
Comment: NEGATIVE
Comment: NEGATIVE
Comment: NORMAL
Diagnosis: NEGATIVE
Diagnosis: REACTIVE
High risk HPV: NEGATIVE
Neisseria Gonorrhea: NEGATIVE

## 2022-02-24 LAB — AFP, SERUM, OPEN SPINA BIFIDA
AFP MoM: 0.85
AFP Value: 30 ng/mL
Gest. Age on Collection Date: 17.1 weeks
Maternal Age At EDD: 33.3 yr
OSBR Risk 1 IN: 10000
Test Results:: NEGATIVE
Weight: 208 [lb_av]

## 2022-02-24 LAB — HEMOGLOBIN A1C
Est. average glucose Bld gHb Est-mCnc: 114 mg/dL
Hgb A1c MFr Bld: 5.6 % (ref 4.8–5.6)

## 2022-02-24 LAB — URINE CULTURE, OB REFLEX: Organism ID, Bacteria: NO GROWTH

## 2022-02-24 LAB — HCV INTERPRETATION

## 2022-02-25 ENCOUNTER — Encounter: Payer: Self-pay | Admitting: *Deleted

## 2022-02-27 LAB — PANORAMA PRENATAL TEST FULL PANEL:PANORAMA TEST PLUS 5 ADDITIONAL MICRODELETIONS: FETAL FRACTION: 8.2

## 2022-02-27 MED ORDER — METRONIDAZOLE 500 MG PO TABS: 500.0000 mg | ORAL_TABLET | Freq: Two times a day (BID) | ORAL | 0 refills | Status: AC

## 2022-03-04 LAB — HORIZON CUSTOM: REPORT SUMMARY: POSITIVE — AB

## 2022-03-08 ENCOUNTER — Other Ambulatory Visit: Payer: Self-pay | Admitting: *Deleted

## 2022-03-08 ENCOUNTER — Ambulatory Visit: Payer: Medicaid Other | Attending: Family Medicine

## 2022-03-08 ENCOUNTER — Encounter: Payer: Self-pay | Admitting: *Deleted

## 2022-03-08 ENCOUNTER — Ambulatory Visit: Payer: Medicaid Other | Admitting: *Deleted

## 2022-03-08 VITALS — BP 127/71 | HR 85

## 2022-03-08 DIAGNOSIS — Z3689 Encounter for other specified antenatal screening: Secondary | ICD-10-CM | POA: Diagnosis present

## 2022-03-08 DIAGNOSIS — Z3480 Encounter for supervision of other normal pregnancy, unspecified trimester: Secondary | ICD-10-CM | POA: Diagnosis not present

## 2022-03-08 DIAGNOSIS — Z363 Encounter for antenatal screening for malformations: Secondary | ICD-10-CM | POA: Insufficient documentation

## 2022-03-08 DIAGNOSIS — O0942 Supervision of pregnancy with grand multiparity, second trimester: Secondary | ICD-10-CM | POA: Insufficient documentation

## 2022-03-08 DIAGNOSIS — D563 Thalassemia minor: Secondary | ICD-10-CM

## 2022-03-08 DIAGNOSIS — Z3A19 19 weeks gestation of pregnancy: Secondary | ICD-10-CM | POA: Insufficient documentation

## 2022-03-08 DIAGNOSIS — O98512 Other viral diseases complicating pregnancy, second trimester: Secondary | ICD-10-CM | POA: Diagnosis not present

## 2022-03-08 DIAGNOSIS — Z362 Encounter for other antenatal screening follow-up: Secondary | ICD-10-CM

## 2022-03-11 IMAGING — US US OB COMP LESS 14 WK
1 series · 15 of 28 positions shown · non-contrast
Comparison: Obstetric ultrasound 3 days ago 06/11/2021

CLINICAL DATA: Vaginal bleeding during pregnancy.

EXAM:
TWIN OBSTETRICAL ULTRASOUND <14 WKS
TECHNIQUE: Transabdominal ultrasound was performed for evaluation of the
gestation as well as the maternal uterus and adnexal regions.

[Series 1: us ob comp less 14 wk · 35 acquisitions, 15 frames shown]
[im 1/35]
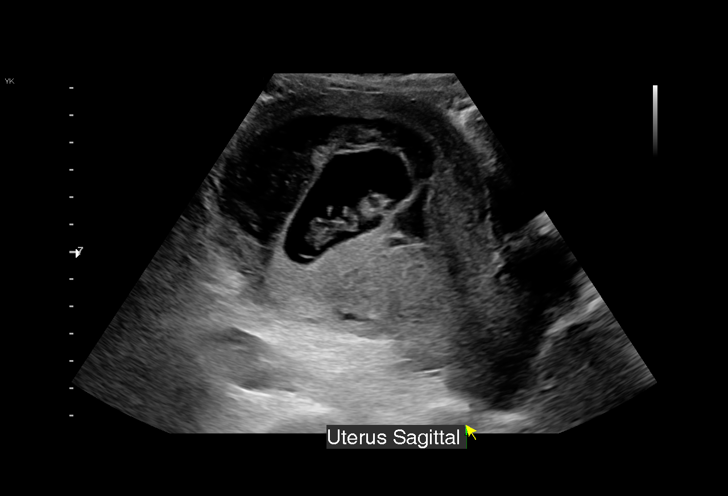
[im 3/35]
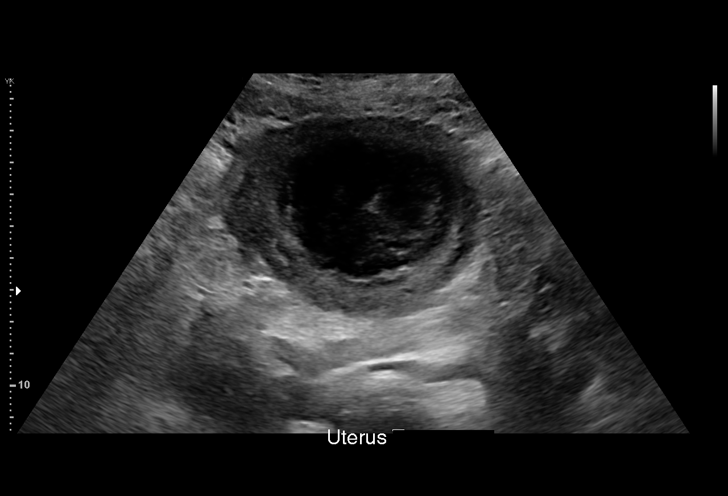
[im 6/35]
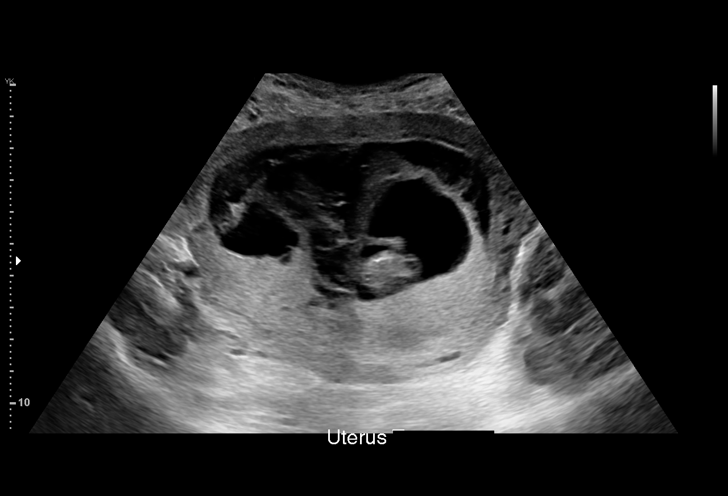
[im 8/35]
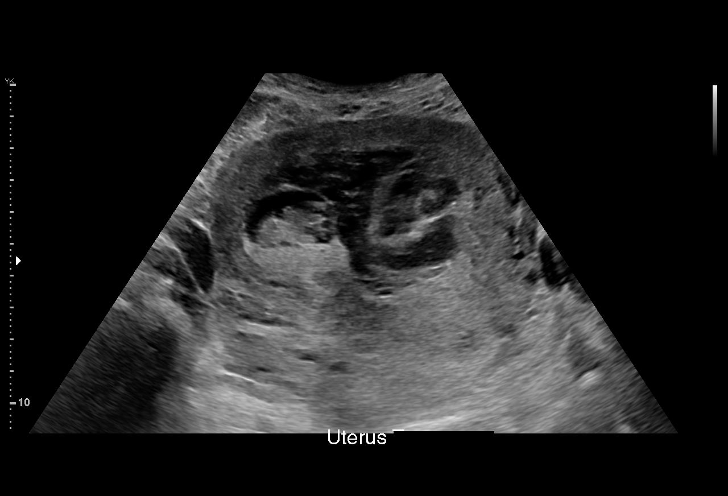
[im 11/35]
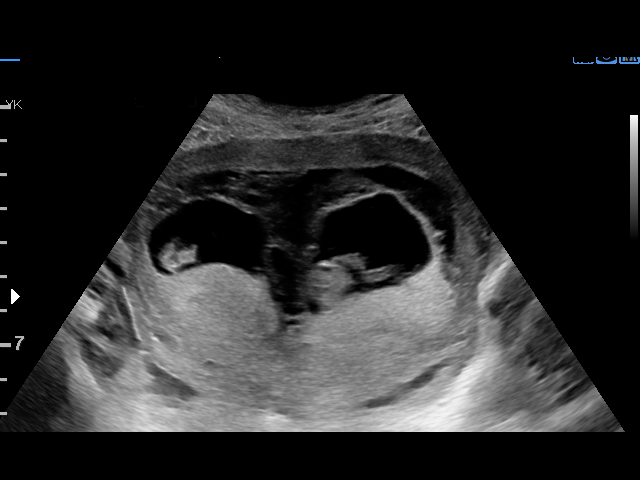
[im 13/35]
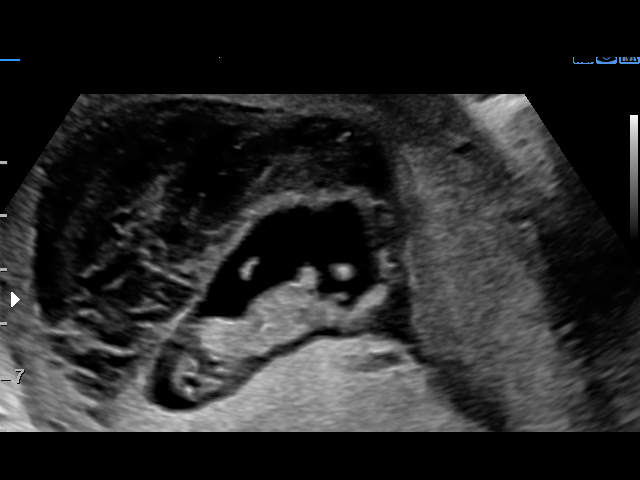
[im 16/35]
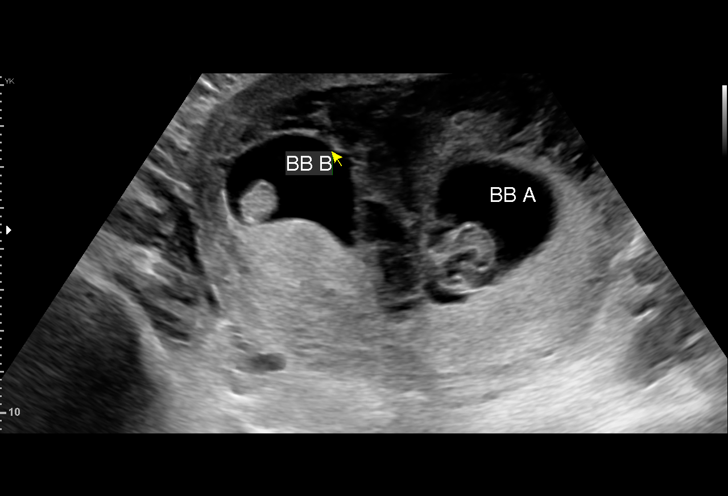
[im 18/35]
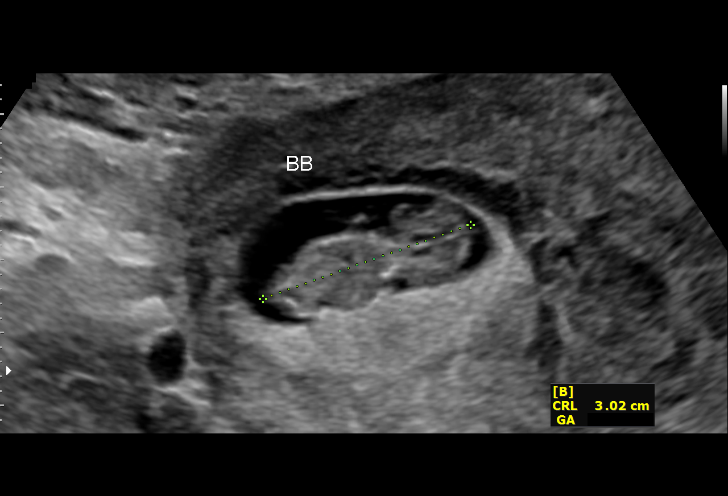
[im 19/35]
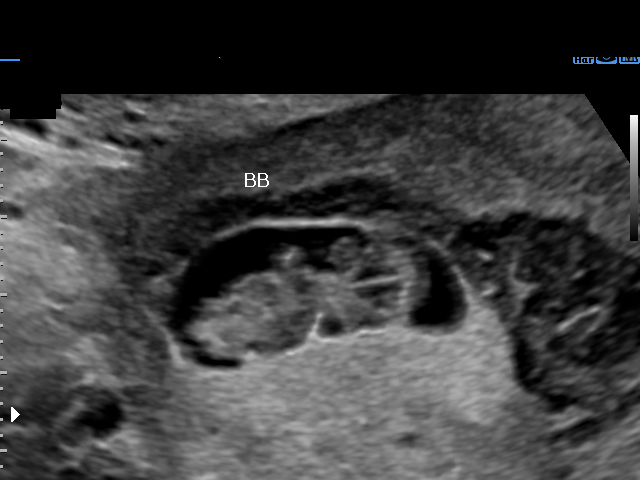
[im 22/35]
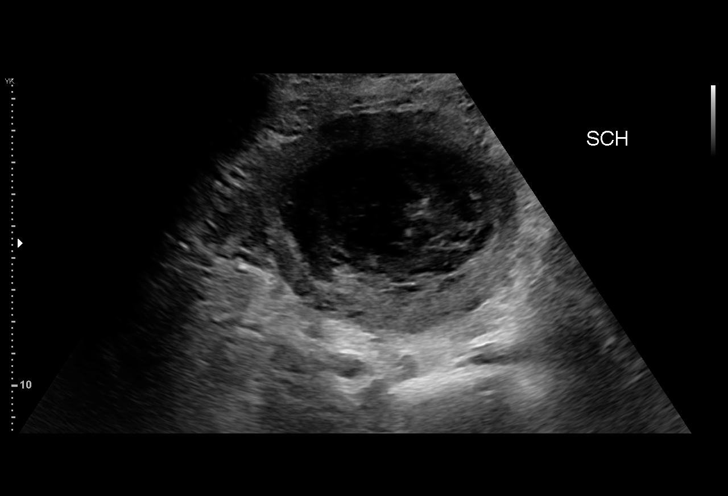
[im 24/35]
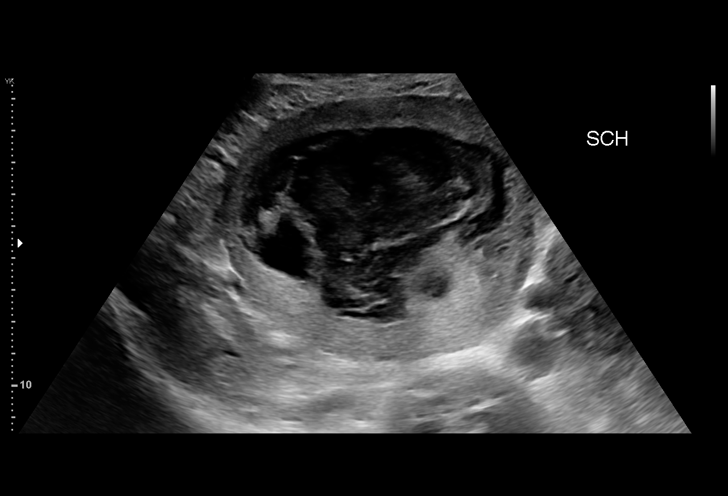
[im 27/35]
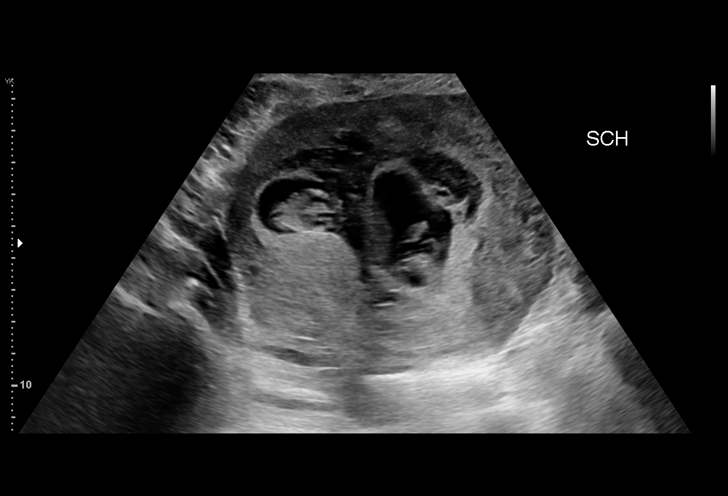
[im 29/35]
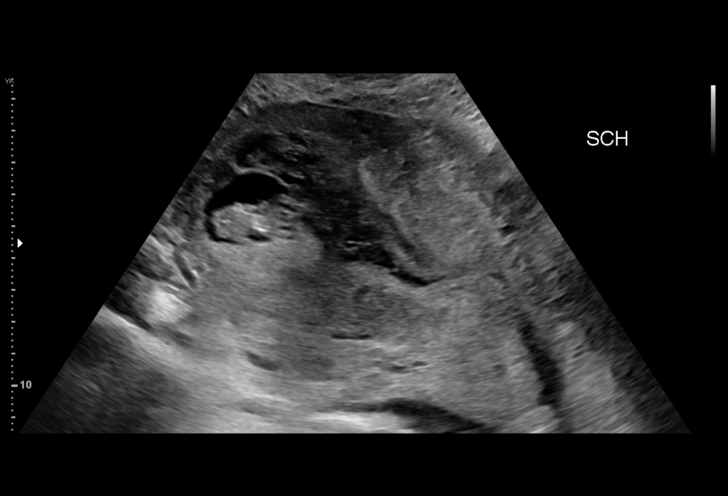
[im 32/35]
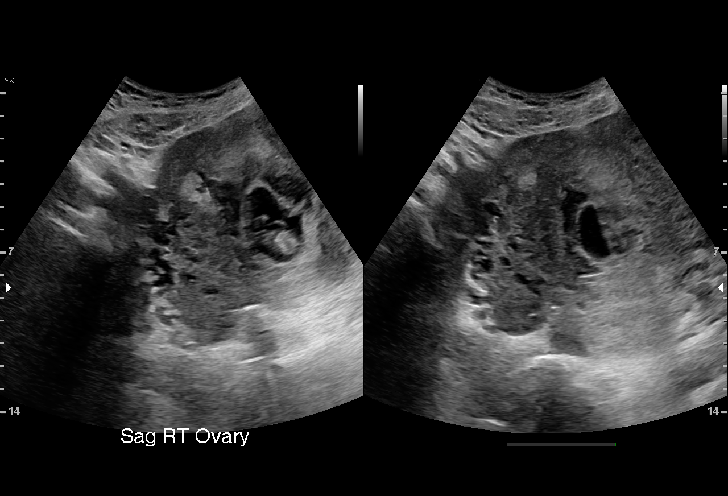
[im 35/35]
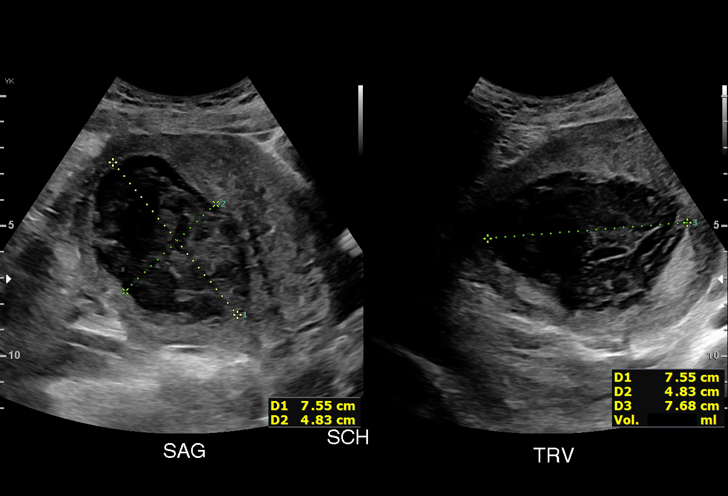

[15 of 28 positions shown; findings below may reference images not displayed]

FINDINGS: Number of IUPs:  2

Chorionicity/Amnionicity:  Dichorionic-diamniotic (thick membrane)

TWIN 1

Yolk sac:  Visualized.

Embryo:  Visualized.

Cardiac Activity: Visualized.

Heart Rate: 183 bpm

CRL:   38.5 mm   10 w 5 d                  US EDC: 01/05/2022

TWIN 2

Yolk sac:  Visualized.

Embryo:  Visualized.

Cardiac Activity: Visualized.

Heart Rate: 173 bpm

CRL:   30.2 mm   9 w 6 d                  US EDC: 01/11/2022

Subchorionic hemorrhage: Large measuring 7.7 x 7.5 x 4.8 cm, this
may be slightly diminished in size from prior exam.

Maternal uterus/adnexae: There again may be myometrial thinning
adjacent to the gestational sac for twin B. both ovaries are
visualized and are normal. There is no adnexal mass or pelvic free
fluid.
IMPRESSION: 1. Live twin intrauterine pregnancy. There is again mildly
asymmetric crown-rump length and gestational age, although there has
been interval fetal growth of both fetus A and B from prior exam.
2. Large subchorionic hemorrhage, possibly slightly diminished in
size from prior.
3. There is mild myometrial thinning overlying the gestational sac
for twin B, which appears similar to prior. Recommend attention at
follow-up.

## 2022-03-25 ENCOUNTER — Encounter: Payer: Self-pay | Admitting: Advanced Practice Midwife

## 2022-03-25 ENCOUNTER — Ambulatory Visit (INDEPENDENT_AMBULATORY_CARE_PROVIDER_SITE_OTHER): Payer: Medicaid Other | Admitting: Advanced Practice Midwife

## 2022-03-25 ENCOUNTER — Other Ambulatory Visit: Payer: Self-pay

## 2022-03-25 VITALS — BP 129/67 | HR 87 | Wt 210.0 lb

## 2022-03-25 DIAGNOSIS — Z3480 Encounter for supervision of other normal pregnancy, unspecified trimester: Secondary | ICD-10-CM

## 2022-03-25 DIAGNOSIS — Z3482 Encounter for supervision of other normal pregnancy, second trimester: Secondary | ICD-10-CM

## 2022-03-25 DIAGNOSIS — Z3A21 21 weeks gestation of pregnancy: Secondary | ICD-10-CM

## 2022-03-25 NOTE — Progress Notes (Signed)
   PRENATAL VISIT NOTE  Subjective:  Yvonne Jenkins is a 31 y.o. G7P5015 at [redacted]w[redacted]d being seen today for ongoing prenatal care.  She is currently monitored for the following issues for this low-risk pregnancy and has Genital herpes simplex; Supervision of other normal pregnancy, antepartum; and Cervical polyp on their problem list.  Patient reports no complaints.  Contractions: Not present.  .  Movement: Present. Denies leaking of fluid.   The following portions of the patient's history were reviewed and updated as appropriate: allergies, current medications, past family history, past medical history, past social history, past surgical history and problem list.   Objective:   Vitals:   03/25/22 1323  BP: 129/67  Pulse: 87  Weight: 210 lb (95.3 kg)    Fetal Status: Fetal Heart Rate (bpm): 156 Fundal Height: 22 cm Movement: Present     General:  Alert, oriented and cooperative. Patient is in no acute distress.  Skin: Skin is warm and dry. No rash noted.   Cardiovascular: Normal heart rate noted  Respiratory: Normal respiratory effort, no problems with respiration noted  Abdomen: Soft, gravid, appropriate for gestational age.  Pain/Pressure: Absent     Pelvic: Cervical exam deferred        Extremities: Normal range of motion.     Mental Status: Normal mood and affect. Normal behavior. Normal judgment and thought content.   Assessment and Plan:  Pregnancy: [id number] at [redacted]w[redacted]d 1. Supervision of other normal pregnancy, antepartum - routine care - Patient would like to go to the food market today  2. [redacted] weeks gestation of pregnancy   Preterm labor symptoms and general obstetric precautions including but not limited to vaginal bleeding, contractions, leaking of fluid and fetal movement were reviewed in detail with the patient. Please refer to After Visit Summary for other counseling recommendations.   Return in about 4 weeks (around 04/22/2022).  Future Appointments  Date Time Provider  Department Center  04/15/2022  9:30 AM [facility] NURSE [facility]  04/15/2022  9:45 AM [facility] US4 [facility]  04/19/2022 10:20 AM Georganna Skeans, MD PCE-PCE None   Thressa Sheller DNP, CNM  03/25/22  1:40 PM

## 2022-03-29 ENCOUNTER — Encounter: Payer: Self-pay | Admitting: *Deleted

## 2022-04-12 NOTE — L&D Delivery Note (Cosign Needed Addendum)
OB/GYN Faculty Practice Delivery Note  Yvonne Jenkins is a 32 y.o. [id number] s/p VD at [redacted]w[redacted]d. She was admitted for Iol gHTN.   ROM: 2h 29m with clear fluid GBS Status:  Negative/-- (03/26 1651) Maximum Maternal Temperature: 97.74F  Labor Progress: Initial SVE: 3.5/50/-2. She then progressed to complete.   Delivery Date/Time: 07/19/22 1621 Delivery: Called to room and patient was complete and pushing. Head delivered ROA. No nuchal cord present. Shoulder and body delivered in usual fashion. Infant with spontaneous cry, placed on mother's abdomen, dried and stimulated. Cord clamped x 2 after 1-minute delay, and cut by FOB. Cord blood drawn. Placenta delivered spontaneously with gentle cord traction. Fundus firm with massage and Pitocin and TXA. Labia, perineum, vagina, and cervix inspected with no lacerations ntoed.  Baby Weight: pending  Placenta: 3 vessel, intact. Sent to L&D Complications: None Lacerations: none EBL: 62 mL Analgesia: Epidural   Infant:  APGAR (1 MIN): 8   APGAR (5 MINS): 9    Yvonne Hawk, DO OB Family Medicine Fellow, [facility] for [employer], [facility] 07/19/2022, 6:00 PM

## 2022-04-15 ENCOUNTER — Other Ambulatory Visit: Payer: Self-pay | Admitting: *Deleted

## 2022-04-15 ENCOUNTER — Ambulatory Visit: Payer: Medicaid Other | Admitting: *Deleted

## 2022-04-15 ENCOUNTER — Ambulatory Visit: Payer: Medicaid Other | Attending: Obstetrics

## 2022-04-15 DIAGNOSIS — Z362 Encounter for other antenatal screening follow-up: Secondary | ICD-10-CM | POA: Diagnosis present

## 2022-04-15 DIAGNOSIS — O10912 Unspecified pre-existing hypertension complicating pregnancy, second trimester: Secondary | ICD-10-CM

## 2022-04-15 DIAGNOSIS — O0942 Supervision of pregnancy with grand multiparity, second trimester: Secondary | ICD-10-CM

## 2022-04-15 DIAGNOSIS — O99891 Other specified diseases and conditions complicating pregnancy: Secondary | ICD-10-CM

## 2022-04-15 DIAGNOSIS — B009 Herpesviral infection, unspecified: Secondary | ICD-10-CM

## 2022-04-15 DIAGNOSIS — D563 Thalassemia minor: Secondary | ICD-10-CM

## 2022-04-15 DIAGNOSIS — Z3A24 24 weeks gestation of pregnancy: Secondary | ICD-10-CM

## 2022-04-15 DIAGNOSIS — O98512 Other viral diseases complicating pregnancy, second trimester: Secondary | ICD-10-CM

## 2022-04-15 DIAGNOSIS — Z148 Genetic carrier of other disease: Secondary | ICD-10-CM

## 2022-04-15 DIAGNOSIS — O99012 Anemia complicating pregnancy, second trimester: Secondary | ICD-10-CM

## 2022-04-19 ENCOUNTER — Ambulatory Visit: Payer: Medicaid Other | Admitting: Family Medicine

## 2022-04-23 NOTE — Progress Notes (Signed)
Alert received through NCNotify system as follows: Patient identified as having high BP reading (140/63) on 04/15/2022  Chart review: No blood pressure follow up documented.  Action needed: BP will be rechecked at next visit on 01/16.   Martina Sinner, RN 04/23/2022  11:52 AM

## 2022-04-25 DIAGNOSIS — J45909 Unspecified asthma, uncomplicated: Secondary | ICD-10-CM | POA: Insufficient documentation

## 2022-04-25 DIAGNOSIS — O99512 Diseases of the respiratory system complicating pregnancy, second trimester: Secondary | ICD-10-CM | POA: Diagnosis not present

## 2022-04-25 DIAGNOSIS — Z3A26 26 weeks gestation of pregnancy: Secondary | ICD-10-CM | POA: Insufficient documentation

## 2022-04-25 DIAGNOSIS — O26892 Other specified pregnancy related conditions, second trimester: Secondary | ICD-10-CM | POA: Diagnosis present

## 2022-04-25 DIAGNOSIS — Z7952 Long term (current) use of systemic steroids: Secondary | ICD-10-CM | POA: Insufficient documentation

## 2022-04-25 DIAGNOSIS — O99612 Diseases of the digestive system complicating pregnancy, second trimester: Secondary | ICD-10-CM | POA: Diagnosis not present

## 2022-04-25 DIAGNOSIS — Z7951 Long term (current) use of inhaled steroids: Secondary | ICD-10-CM | POA: Diagnosis not present

## 2022-04-25 DIAGNOSIS — K219 Gastro-esophageal reflux disease without esophagitis: Secondary | ICD-10-CM | POA: Insufficient documentation

## 2022-04-26 ENCOUNTER — Other Ambulatory Visit: Payer: Self-pay

## 2022-04-26 ENCOUNTER — Emergency Department (HOSPITAL_COMMUNITY)
Admission: EM | Admit: 2022-04-26 | Discharge: 2022-04-26 | Disposition: A | Discharge status: Home / Self Care | Payer: Medicaid Other | Attending: Emergency Medicine | Admitting: Emergency Medicine

## 2022-04-26 DIAGNOSIS — J45909 Unspecified asthma, uncomplicated: Secondary | ICD-10-CM

## 2022-04-26 MED ORDER — PREDNISONE 20 MG PO TABS: 40.0000 mg | ORAL_TABLET | Freq: Every day | ORAL | 0 refills | Status: DC

## 2022-04-26 NOTE — Discharge Instructions (Signed)
Take prednisone as prescribed until finished. We do advise use of your albuterol inhalers, previously prescribed. Follow up with a primary care doctor for evaluation of ongoing symptoms.

## 2022-04-26 NOTE — ED Provider Notes (Signed)
[facility] EMERGENCY DEPARTMENT Provider Note   CSN: [id number] Arrival date & time: 04/25/22  2347     History  Chief Complaint  Patient presents with   Chest Pain    Yvonne Jenkins is a 32 y.o. female.  32 y/o female with hx of asthma presents to the ED for c/o chest tightness and SOB. States her symptoms feel similar to exacerbations of asthma. She has prescriptions for inhalers at the pharmacy, but does not pick them up. Patient reports that the "blue inhaler" is the only one to work for her and she will not use the red or yellow ones because they "do not work". She is requesting a course of prednisone for management of her asthma symptoms. Denies fever, syncope, hemoptysis, leg swelling, recent surgeries or hospitalizations.  She is G7P5 and approximately [redacted] weeks pregnant.  The history is provided by the patient. No language interpreter was used.  Chest Pain      Home Medications Prior to Admission medications   Medication Sig Start Date End Date Taking? Authorizing Provider  albuterol (VENTOLIN HFA) 108 (90 Base) MCG/ACT inhaler Inhale 2 puffs into the lungs every 2 (two) hours as needed for wheezing or shortness of breath (cough). 02/10/22   Domenic Moras, PA-C  Blood Pressure Monitoring DEVI 1 each by Does not apply route once a week. Patient not taking: Reported on 02/22/2022 12/29/21   Autry-Lott, Naaman Plummer, DO  cetirizine (ZYRTEC) 10 MG tablet Take 1 tablet (10 mg total) by mouth daily. Patient not taking: Reported on 02/22/2022 10/20/21   Dorna Mai, MD  predniSONE (DELTASONE) 20 MG tablet Take 2 tablets (40 mg total) by mouth daily. 04/26/22   Antonietta Breach, PA-C  Prenatal Vit-Fe Fumarate-FA (MULTIVITAMIN-PRENATAL) 27-0.8 MG TABS tablet Take 1 tablet by mouth daily at 12 noon.    [provider]      Allergies    Patient has no known allergies.    Review of Systems   Review of Systems  Cardiovascular:  Positive for chest pain.  Ten  systems reviewed and are negative for acute change, except as noted in the HPI.    Physical Exam Updated Vital Signs BP 134/83   Pulse 85   Temp 98.2 F (36.8 C) (Oral)   Resp 19   Wt 95.3 kg   LMP 10/25/2021   SpO2 99%   BMI 32.89 kg/m   Physical Exam Vitals and nursing note reviewed.  Constitutional:      General: She is not in acute distress.    Appearance: She is well-developed. She is not diaphoretic.     Comments: Nontoxic appearing and in NAD  HENT:     Head: Normocephalic and atraumatic.  Eyes:     General: No scleral icterus.    Conjunctiva/sclera: Conjunctivae normal.  Cardiovascular:     Rate and Rhythm: Normal rate and regular rhythm.     Pulses: Normal pulses.  Pulmonary:     Effort: Pulmonary effort is normal. No respiratory distress.     Comments: Lungs CTAB. Respirations even and unlabored. Speaking in full sentences. Musculoskeletal:        General: Normal range of motion.     Cervical back: Normal range of motion.  Skin:    General: Skin is warm and dry.     Coloration: Skin is not pale.     Findings: No erythema or rash.  Neurological:     Mental Status: She is alert and oriented to person, place, and  time.  Psychiatric:        Behavior: Behavior normal.    ED Results / Procedures / Treatments   Labs (all labs ordered are listed, but only abnormal results are displayed) Labs Reviewed - No data to display  EKG None  Radiology No results found.  Procedures Procedures    Medications Ordered in ED Medications - No data to display  ED Course/ Medical Decision Making/ A&P                             Medical Decision Making Risk Prescription drug management.   This patient presents to the ED for concern of SOB, this involves an extensive number of treatment options, and is a complaint that carries with it a high risk of complications and morbidity.  The differential diagnosis includes asthma exacerbation vs symptomatic anemia vs  viral illness vs PTX vs PNA vs PE.   Co morbidities that complicate the patient evaluation  Asthma  Pregnancy    Additional history obtained:  Additional history obtained from partner at bedside External records from outside source obtained and reviewed including prior ED visits since 2019. CXR from November 2023 reviewed and was normal.   Cardiac Monitoring:  The patient was maintained on a cardiac monitor.  I personally viewed and interpreted the cardiac monitored which showed an underlying rhythm of: NSR   Medicines ordered and prescription drug management:  I have reviewed the patients home medicines and have made adjustments as needed   Test Considered:  CXR   Problem List / ED Course:  Patient requesting prednisone Rx for symptoms. She denies recent fever or URI symptoms. No tachypnea, dyspnea, hypoxia in the ED. Doubt PNA. Clear lung sounds bilaterally. No concern for PTX. PE considered given pregnancy, but patient has history of similar presentations per chart review. Her Well's PE score is 0. Clinically feel this is unlikely based on her history and physical exam. Patient declines all inhalers as she reports them to be ineffective. Will d/c with 5 day prednisone burst. Advised PCP follow up.   Reevaluation:  After the interventions noted above, I reevaluated the patient and found that they have :stayed the same   Social Determinants of Health:  Insured patient   Dispostion:  After consideration of the diagnostic results and the patients response to treatment, I feel that the patent would benefit from outpatient PCP follow up for evaluation of ongoing SOB complaints.          Final Clinical Impression(s) / ED Diagnoses Final diagnoses:  Uncomplicated asthma, unspecified asthma severity, unspecified whether persistent    Rx / DC Orders ED Discharge Orders          Ordered    predniSONE (DELTASONE) 20 MG tablet  Daily,   Status:  Discontinued         04/26/22 0035    predniSONE (DELTASONE) 20 MG tablet  Daily,   Status:  Discontinued        04/26/22 0046    predniSONE (DELTASONE) 20 MG tablet  Daily        04/26/22 0118              Antonietta Breach, PA-C 04/26/22 0146    Quintella Reichert, MD 04/26/22 [phone]

## 2022-04-26 NOTE — ED Triage Notes (Signed)
Pt arrives with chest pain that started yesterday. Pt has hx of asthma. Per pt, she does not have any inhalers at home. Pt endorses SOB.

## 2022-04-27 ENCOUNTER — Ambulatory Visit (INDEPENDENT_AMBULATORY_CARE_PROVIDER_SITE_OTHER): Payer: Medicaid Other | Admitting: Advanced Practice Midwife

## 2022-04-27 ENCOUNTER — Encounter: Payer: Self-pay | Admitting: Advanced Practice Midwife

## 2022-04-27 VITALS — BP 120/75 | HR 101 | Wt 206.0 lb

## 2022-04-27 DIAGNOSIS — Z3480 Encounter for supervision of other normal pregnancy, unspecified trimester: Secondary | ICD-10-CM

## 2022-04-27 DIAGNOSIS — Z3A26 26 weeks gestation of pregnancy: Secondary | ICD-10-CM

## 2022-04-27 MED ORDER — FLUTICASONE PROPIONATE HFA 44 MCG/ACT IN AERO: 2.0000 | INHALATION_SPRAY | Freq: Two times a day (BID) | RESPIRATORY_TRACT | 3 refills | Status: AC

## 2022-04-27 NOTE — Progress Notes (Signed)
   PRENATAL VISIT NOTE  Subjective:  Yvonne Jenkins is a 32 y.o. G7P5015 at [redacted]w[redacted]d being seen today for ongoing prenatal care.  She is currently monitored for the following issues for this low-risk pregnancy and has Genital herpes simplex; Supervision of other normal pregnancy, antepartum; and Cervical polyp on their problem list.  Patient reports no complaints.  Contractions: Not present. Vag. Bleeding: None.  Movement: Present. Denies leaking of fluid.   The following portions of the patient's history were reviewed and updated as appropriate: allergies, current medications, past family history, past medical history, past social history, past surgical history and problem list.   Objective:   Vitals:   04/27/22 0826  BP: 120/75  Pulse: (!) 101  Weight: 206 lb (93.4 kg)    Fetal Status: Fetal Heart Rate (bpm): 145   Movement: Present     General:  Alert, oriented and cooperative. Patient is in no acute distress.  Skin: Skin is warm and dry. No rash noted.   Cardiovascular: Normal heart rate noted  Respiratory: Normal respiratory effort, no problems with respiration noted  Abdomen: Soft, gravid, appropriate for gestational age.  Pain/Pressure: Present     Pelvic: Cervical exam deferred        Extremities: Normal range of motion.  Edema: Trace  Mental Status: Normal mood and affect. Normal behavior. Normal judgment and thought content.   Assessment and Plan:  Pregnancy: [id number] at [redacted]w[redacted]d 1. Supervision of other normal pregnancy, antepartum - Patient currently on a 5 day course of prednisone for asthma exacerbation. Will hold off on GTT until next visit.  - Patient has never tried a daily inhaler for her asthma. She has only used albuterol PRN. Will start on flovent and see if this helps control symptoms. If not, we can refer to pulmonology.   2. [redacted] weeks gestation of pregnancy   Preterm labor symptoms and general obstetric precautions including but not limited to vaginal bleeding,  contractions, leaking of fluid and fetal movement were reviewed in detail with the patient. Please refer to After Visit Summary for other counseling recommendations.   Return in about 3 weeks (around 05/18/2022).  Future Appointments  Date Time Provider [place]  05/13/2022  8:45 AM [facility] NURSE [facility]  05/13/2022  9:00 AM [facility] US1 [facility] [place] DNP, CNM  04/27/22  8:36 AM

## 2022-05-06 ENCOUNTER — Other Ambulatory Visit: Payer: Self-pay

## 2022-05-06 ENCOUNTER — Other Ambulatory Visit: Payer: Medicaid Other

## 2022-05-06 DIAGNOSIS — Z3480 Encounter for supervision of other normal pregnancy, unspecified trimester: Secondary | ICD-10-CM

## 2022-05-13 ENCOUNTER — Ambulatory Visit: Payer: Medicaid Other | Admitting: *Deleted

## 2022-05-13 ENCOUNTER — Ambulatory Visit: Payer: Medicaid Other | Attending: Obstetrics and Gynecology

## 2022-05-13 VITALS — BP 124/67 | HR 82

## 2022-05-13 DIAGNOSIS — B009 Herpesviral infection, unspecified: Secondary | ICD-10-CM

## 2022-05-13 DIAGNOSIS — O99891 Other specified diseases and conditions complicating pregnancy: Secondary | ICD-10-CM | POA: Diagnosis not present

## 2022-05-13 DIAGNOSIS — O0942 Supervision of pregnancy with grand multiparity, second trimester: Secondary | ICD-10-CM | POA: Diagnosis present

## 2022-05-13 DIAGNOSIS — O0943 Supervision of pregnancy with grand multiparity, third trimester: Secondary | ICD-10-CM | POA: Diagnosis not present

## 2022-05-13 DIAGNOSIS — O10912 Unspecified pre-existing hypertension complicating pregnancy, second trimester: Secondary | ICD-10-CM | POA: Diagnosis present

## 2022-05-13 DIAGNOSIS — Z3689 Encounter for other specified antenatal screening: Secondary | ICD-10-CM | POA: Diagnosis present

## 2022-05-13 DIAGNOSIS — Z3A28 28 weeks gestation of pregnancy: Secondary | ICD-10-CM

## 2022-05-13 DIAGNOSIS — O98513 Other viral diseases complicating pregnancy, third trimester: Secondary | ICD-10-CM | POA: Diagnosis not present

## 2022-05-13 DIAGNOSIS — Z148 Genetic carrier of other disease: Secondary | ICD-10-CM

## 2022-05-18 ENCOUNTER — Other Ambulatory Visit: Payer: Self-pay

## 2022-05-18 ENCOUNTER — Ambulatory Visit (INDEPENDENT_AMBULATORY_CARE_PROVIDER_SITE_OTHER): Payer: Medicaid Other | Admitting: Obstetrics and Gynecology

## 2022-05-18 VITALS — BP 123/69 | HR 97 | Wt 208.4 lb

## 2022-05-18 DIAGNOSIS — Z23 Encounter for immunization: Secondary | ICD-10-CM | POA: Diagnosis not present

## 2022-05-18 DIAGNOSIS — Z3480 Encounter for supervision of other normal pregnancy, unspecified trimester: Secondary | ICD-10-CM

## 2022-05-18 DIAGNOSIS — Z3483 Encounter for supervision of other normal pregnancy, third trimester: Secondary | ICD-10-CM

## 2022-05-18 DIAGNOSIS — Z3A29 29 weeks gestation of pregnancy: Secondary | ICD-10-CM

## 2022-05-18 NOTE — Addendum Note (Signed)
Addended by: Donell Beers T on: 05/18/2022 05:35 PM   Modules accepted: Orders

## 2022-05-18 NOTE — Progress Notes (Signed)
   PRENATAL VISIT NOTE  Subjective:  Yvonne Jenkins is a 32 y.o. G7P5015 at [redacted]w[redacted]d being seen today for ongoing prenatal care.  She is currently monitored for the following issues for this low-risk pregnancy and has Genital herpes simplex; Supervision of other normal pregnancy, antepartum; and Cervical polyp on their problem list.  Patient reports no complaints.  Contractions: Not present. Vag. Bleeding: None.  Movement: Present. Denies leaking of fluid.   Feels this baby is bigger than her others.   The following portions of the patient's history were reviewed and updated as appropriate: allergies, current medications, past family history, past medical history, past social history, past surgical history and problem list.   Objective:   Vitals:   05/18/22 0855  BP: 123/69  Pulse: 97  Weight: 208 lb 6.4 oz (94.5 kg)    Fetal Status: Fetal Heart Rate (bpm): 152 Fundal Height: 29 cm Movement: Present     General:  Alert, oriented and cooperative. Patient is in no acute distress.  Skin: Skin is warm and dry. No rash noted.   Cardiovascular: Normal heart rate noted  Respiratory: Normal respiratory effort, no problems with respiration noted  Abdomen: Soft, gravid, appropriate for gestational age.  Pain/Pressure: Present     Pelvic: Cervical exam deferred        Extremities: Normal range of motion.  Edema: Trace  Mental Status: Normal mood and affect. Normal behavior. Normal judgment and thought content.   Assessment and Plan:  Pregnancy: [id number] at [redacted]w[redacted]d 1. Supervision of other normal pregnancy, antepartum FH and FHR wnl  2. [redacted] weeks gestation of pregnancy GTT today 3rd tri labs TDaP today   Preterm labor symptoms and general obstetric precautions including but not limited to vaginal bleeding, contractions, leaking of fluid and fetal movement were reviewed in detail with the patient. Please refer to After Visit Summary for other counseling recommendations.   No follow-ups on  file.  Future Appointments  Date Time Provider [place]  05/25/2022  8:50 AM [facility]    Darliss Cheney, MD

## 2022-05-19 LAB — RPR: RPR Ser Ql: NONREACTIVE

## 2022-05-19 LAB — CBC
Hematocrit: 30.2 % — ABNORMAL LOW (ref 34.0–46.6)
Hemoglobin: 9.8 g/dL — ABNORMAL LOW (ref 11.1–15.9)
MCH: 24.7 pg — ABNORMAL LOW (ref 26.6–33.0)
MCHC: 32.5 g/dL (ref 31.5–35.7)
MCV: 76 fL — ABNORMAL LOW (ref 79–97)
Platelets: 137 10*3/uL — ABNORMAL LOW (ref 150–450)
RBC: 3.96 x10E6/uL (ref 3.77–5.28)
RDW: 14.9 % (ref 11.7–15.4)
WBC: 6.3 10*3/uL (ref 3.4–10.8)

## 2022-05-19 LAB — GLUCOSE TOLERANCE, 2 HOURS W/ 1HR
Glucose, 1 hour: 121 mg/dL (ref 70–179)
Glucose, 2 hour: 95 mg/dL (ref 70–152)
Glucose, Fasting: 81 mg/dL (ref 70–91)

## 2022-05-19 LAB — HIV ANTIBODY (ROUTINE TESTING W REFLEX): HIV Screen 4th Generation wRfx: NONREACTIVE

## 2022-05-20 ENCOUNTER — Other Ambulatory Visit: Payer: Self-pay | Admitting: Obstetrics and Gynecology

## 2022-05-20 DIAGNOSIS — O99013 Anemia complicating pregnancy, third trimester: Secondary | ICD-10-CM

## 2022-05-20 MED ORDER — FERROUS FUMARATE 325 (106 FE) MG PO TABS: 1.0000 | ORAL_TABLET | ORAL | 5 refills | Status: DC

## 2022-05-25 ENCOUNTER — Other Ambulatory Visit: Payer: Medicaid Other

## 2022-06-01 ENCOUNTER — Other Ambulatory Visit: Payer: Self-pay

## 2022-06-01 ENCOUNTER — Ambulatory Visit (INDEPENDENT_AMBULATORY_CARE_PROVIDER_SITE_OTHER): Payer: Medicaid Other | Admitting: Family Medicine

## 2022-06-01 VITALS — BP 126/72 | HR 90 | Wt 211.8 lb

## 2022-06-01 DIAGNOSIS — Z3480 Encounter for supervision of other normal pregnancy, unspecified trimester: Secondary | ICD-10-CM

## 2022-06-01 DIAGNOSIS — Z3490 Encounter for supervision of normal pregnancy, unspecified, unspecified trimester: Secondary | ICD-10-CM

## 2022-06-01 DIAGNOSIS — Z8619 Personal history of other infectious and parasitic diseases: Secondary | ICD-10-CM

## 2022-06-01 DIAGNOSIS — Z3483 Encounter for supervision of other normal pregnancy, third trimester: Secondary | ICD-10-CM

## 2022-06-01 DIAGNOSIS — Z3A31 31 weeks gestation of pregnancy: Secondary | ICD-10-CM

## 2022-06-01 NOTE — Patient Instructions (Signed)
[facility] Appointment on Thursday, February 22nd at 10:30AM. Please arrive by 10:15AM.  Location: [facility], 2nd Floor                  [address]                  [place],  [address]  Phone: [phone]

## 2022-06-01 NOTE — Progress Notes (Signed)
   PRENATAL VISIT NOTE  Subjective:  Yvonne Jenkins is a 32 y.o. G7P5015 at 38w2dbeing seen today for ongoing prenatal care.  She is currently monitored for the following issues for this low-risk pregnancy and has Genital herpes simplex; Supervision of other normal pregnancy, antepartum; and Cervical polyp on their problem list.  Patient reports no complaints.  Contractions: Not present. Vag. Bleeding: None.  Movement: Present. Denies leaking of fluid.   The following portions of the patient's history were reviewed and updated as appropriate: allergies, current medications, past family history, past medical history, past social history, past surgical history and problem list.   Objective:   Vitals:   06/01/22 1329  BP: 126/72  Pulse: 90  Weight: 211 lb 12.8 oz (96.1 kg)    Fetal Status: Fetal Heart Rate (bpm): 138 Fundal Height: 35 cm Movement: Present     General:  Alert, oriented and cooperative. Patient is in no acute distress.  Skin: Skin is warm and dry. No rash noted.   Cardiovascular: Normal heart rate noted  Respiratory: Normal respiratory effort, no problems with respiration noted  Abdomen: Soft, gravid, appropriate for gestational age.  Pain/Pressure: Absent     Pelvic: Cervical exam deferred        Extremities: Normal range of motion.  Edema: Trace  Mental Status: Normal mood and affect. Normal behavior. Normal judgment and thought content.   Assessment and Plan:  Pregnancy: [id number] 318w2d. Supervision of other normal pregnancy, antepartum No acute concerns.  2. History of herpes genitalis Needs Valtrex at 36 weeks.   3. [redacted] weeks gestation of pregnancy Follow up in 2 weeks  4. Fundal height high for dates 2/1 [place] EFW 84%. Will schedule follow up. Normal glucose testing.    Preterm labor symptoms and general obstetric precautions including but not limited to vaginal bleeding, contractions, leaking of fluid and fetal movement were reviewed in detail  with the patient. Please refer to After Visit Summary for other counseling recommendations.   No follow-ups on file.  Future Appointments  Date Time Provider [place]/08/2022  3:15 PM BaGriffin BasilMD [facility]  Beulah Matusek Autry-Lott, DO

## 2022-06-03 ENCOUNTER — Other Ambulatory Visit: Payer: Self-pay | Admitting: *Deleted

## 2022-06-03 ENCOUNTER — Ambulatory Visit: Payer: Medicaid Other | Attending: Family Medicine

## 2022-06-03 ENCOUNTER — Emergency Department (HOSPITAL_COMMUNITY)
Admission: EM | Admit: 2022-06-03 | Discharge: 2022-06-03 | Discharge status: Against Medical Advice | Payer: Medicaid Other | Attending: Emergency Medicine | Admitting: Emergency Medicine

## 2022-06-03 ENCOUNTER — Ambulatory Visit: Payer: Medicaid Other | Admitting: *Deleted

## 2022-06-03 VITALS — BP 131/61 | HR 96

## 2022-06-03 DIAGNOSIS — O26843 Uterine size-date discrepancy, third trimester: Secondary | ICD-10-CM | POA: Diagnosis not present

## 2022-06-03 DIAGNOSIS — H5789 Other specified disorders of eye and adnexa: Secondary | ICD-10-CM

## 2022-06-03 DIAGNOSIS — J45909 Unspecified asthma, uncomplicated: Secondary | ICD-10-CM | POA: Insufficient documentation

## 2022-06-03 DIAGNOSIS — O0943 Supervision of pregnancy with grand multiparity, third trimester: Secondary | ICD-10-CM

## 2022-06-03 DIAGNOSIS — Z5329 Procedure and treatment not carried out because of patient's decision for other reasons: Secondary | ICD-10-CM | POA: Insufficient documentation

## 2022-06-03 DIAGNOSIS — Z7952 Long term (current) use of systemic steroids: Secondary | ICD-10-CM | POA: Diagnosis not present

## 2022-06-03 DIAGNOSIS — O409XX0 Polyhydramnios, unspecified trimester, not applicable or unspecified: Secondary | ICD-10-CM

## 2022-06-03 DIAGNOSIS — O403XX0 Polyhydramnios, third trimester, not applicable or unspecified: Secondary | ICD-10-CM | POA: Insufficient documentation

## 2022-06-03 DIAGNOSIS — Z7951 Long term (current) use of inhaled steroids: Secondary | ICD-10-CM | POA: Diagnosis not present

## 2022-06-03 DIAGNOSIS — Z3A31 31 weeks gestation of pregnancy: Secondary | ICD-10-CM | POA: Diagnosis not present

## 2022-06-03 DIAGNOSIS — O98513 Other viral diseases complicating pregnancy, third trimester: Secondary | ICD-10-CM | POA: Diagnosis not present

## 2022-06-03 DIAGNOSIS — Z3490 Encounter for supervision of normal pregnancy, unspecified, unspecified trimester: Secondary | ICD-10-CM | POA: Diagnosis present

## 2022-06-03 MED ORDER — TETRACAINE HCL 0.5 % OP SOLN
1.0000 [drp] | Freq: Once | OPHTHALMIC | Status: DC
  Filled 2022-06-03: qty 4

## 2022-06-03 MED ORDER — FLUORESCEIN SODIUM 1 MG OP STRP
1.0000 | ORAL_STRIP | Freq: Once | OPHTHALMIC | Status: DC
  Filled 2022-06-03: qty 1

## 2022-06-03 NOTE — ED Provider Triage Note (Signed)
Emergency Medicine Provider Triage Evaluation Note  Yvonne Jenkins , a 32 y.o. female  was evaluated in triage.  Pt complains of swelling and pain to the right hip.  She reports has been going on for the past 2 days.  She felt as though something was in it.  Review of Systems  Positive:  Negative:   Physical Exam  BP 138/78 (BP Location: Right Arm)   Pulse 88   Temp (!) 97.4 F (36.3 C) (Oral)   Resp 20   LMP 10/25/2021   SpO2 98%  Gen:   Awake, no distress   Resp:  Normal effort  MSK:   Moves extremities without difficulty  Other:  Periorbital swelling.  EOMs intact without pain.  PERRLA  Medical Decision Making  Medically screening exam initiated at 12:22 PM.  Appropriate orders placed.  Yvonne Jenkins was informed that the remainder of the evaluation will be completed by another provider, this initial triage assessment does not replace that evaluation, and the importance of remaining in the ED until their evaluation is complete.     Rhae Hammock, PA-C 06/03/22 1223

## 2022-06-03 NOTE — ED Provider Notes (Signed)
[facility] Provider Note   CSN: [id number] Arrival date & time: 06/03/22  1132     History  Chief Complaint  Patient presents with   Eye Problem    Yvonne Jenkins is a 32 y.o. female with a past medical history significant for asthma who presents to the ED due to right eye edema and erythema that started yesterday.  Patient's son had pinkeye 2 weeks ago.  Patient states her eye was crusted shut upon waking this morning.  Patient is currently [redacted] weeks pregnant.  No fever or chills.  Admits to some blurry vision from right eye.  Denies pain with EOMs.  No injury to eye.  Patient notes she feels like a foreign bodies in her eye.  History obtained from patient and past medical records. No interpreter used during encounter.       Home Medications Prior to Admission medications   Medication Sig Start Date End Date Taking? Authorizing Provider  albuterol (VENTOLIN HFA) 108 (90 Base) MCG/ACT inhaler Inhale 2 puffs into the lungs every 2 (two) hours as needed for wheezing or shortness of breath (cough). 02/10/22   Domenic Moras, PA-C  Blood Pressure Monitoring DEVI 1 each by Does not apply route once a week. Patient not taking: Reported on 02/22/2022 12/29/21   Autry-Lott, Naaman Plummer, DO  cetirizine (ZYRTEC) 10 MG tablet Take 1 tablet (10 mg total) by mouth daily. Patient not taking: Reported on 02/22/2022 10/20/21   Dorna Mai, MD  ferrous fumarate (HEMOCYTE - 106 MG FE) 325 (106 Fe) MG TABS tablet Take 1 tablet (106 mg of iron total) by mouth every other day. Patient not taking: Reported on 06/01/2022 05/20/22   Darliss Cheney, MD  fluticasone (FLOVENT HFA) 44 MCG/ACT inhaler Inhale 2 puffs into the lungs 2 (two) times daily. 04/27/22   Tresea Mall, CNM  predniSONE (DELTASONE) 20 MG tablet Take 2 tablets (40 mg total) by mouth daily. Patient not taking: Reported on 05/13/2022 04/26/22   Antonietta Breach, PA-C  Prenatal Vit-Fe Fumarate-FA  (MULTIVITAMIN-PRENATAL) 27-0.8 MG TABS tablet Take 1 tablet by mouth daily at 12 noon.    [provider]      Allergies    Patient has no known allergies.    Review of Systems   Review of Systems  Constitutional:  Negative for fever.  Eyes:  Positive for pain, redness and visual disturbance.    Physical Exam Updated Vital Signs BP 138/78 (BP Location: Right Arm)   Pulse 88   Temp (!) 97.4 F (36.3 C) (Oral)   Resp 20   LMP 10/25/2021   SpO2 98%  Physical Exam Vitals and nursing note reviewed.  Constitutional:      General: She is not in acute distress.    Appearance: She is not ill-appearing.  HENT:     Head: Normocephalic.  Eyes:     Pupils: Pupils are equal, round, and reactive to light.     Comments: EOMs intact.  Injected right conjunctiva.  The right eyelid with mild erythema.  No pain with EOMs.  Cardiovascular:     Rate and Rhythm: Normal rate and regular rhythm.     Pulses: Normal pulses.     Heart sounds: Normal heart sounds. No murmur heard.    No friction rub. No gallop.  Pulmonary:     Effort: Pulmonary effort is normal.     Breath sounds: Normal breath sounds.  Abdominal:     General: Abdomen is flat.  There is no distension.     Palpations: Abdomen is soft.     Tenderness: There is no abdominal tenderness. There is no guarding or rebound.  Musculoskeletal:        General: Normal range of motion.     Cervical back: Neck supple.  Skin:    General: Skin is warm and dry.  Neurological:     General: No focal deficit present.     Mental Status: She is alert.  Psychiatric:        Mood and Affect: Mood normal.        Behavior: Behavior normal.     ED Results / Procedures / Treatments   Labs (all labs ordered are listed, but only abnormal results are displayed) Labs Reviewed - No data to display  EKG None  Radiology [place] MFM OB FOLLOW UP  Result Date: 06/03/2022 ----------------------------------------------------------------------   OBSTETRICS REPORT                       (Signed Final 06/03/2022 11:08 am) ---------------------------------------------------------------------- Patient Info  ID #:       [id number]                          D.O.B.:  Dec 16, 1990 (31 yrs)  Name:       Yvonne Jenkins                   Visit Date: 06/03/2022 10:22 am ---------------------------------------------------------------------- Performed By  Attending:        Tama High MD        Ref. Address:     [place], [place]  Performed By:     Rosana Hoes          [facility]  Referred By:      Donnamae Jude                    MD ---------------------------------------------------------------------- Orders  #  Description                           Code        Ordered By  1  [place] MFM OB FOLLOW UP                   [id number]    Darron Doom ----------------------------------------------------------------------  #  Order #                     Accession #                Episode #  1  [id number]                   [id number]                 [id number] ---------------------------------------------------------------------- Indications  Uterine size-date discrepancy, third trimester O26.843  Polyhydramnios, third trimester, antepartum    O40.3XX0  condition or complication, unspecified fetus  Grand multiparity, antepartum                  O09.40  Herpes simplex virus (HSV)                     O98.519 B00.9  Genetic carrier Electronics engineer Thal)    Z14.8  [redacted] weeks gestation of pregnancy                Z3A.31  LR NIPS - Female, Negative AFP ----------------------------------------------------------------------  Fetal Evaluation  Num Of Fetuses:         1  Fetal Heart Rate(bpm):  144  Cardiac Activity:       Observed  Presentation:           Cephalic  Placenta:               Posterior  P. Cord Insertion:      Previously visualized  Amniotic Fluid  AFI FV:      Polyhydramnios  AFI Sum(cm)     %Tile       Largest Pocket(cm)  24.98           96          7.98  RUQ(cm)       RLQ(cm)       LUQ(cm)        LLQ(cm)  5.77          6.24          7.98           4.99 ---------------------------------------------------------------------- Biometry  BPD:      79.9  mm     G. Age:  32w 1d         56  %    CI:        77.12   %    70 - 86                                                          FL/HC:  22.2   %    19.1 - 21.3  HC:      288.1  mm     G. Age:  31w 5d         17  %    HC/AC:      0.96        0.96 - 1.17  AC:       301   mm     G. Age:  34w 0d         97  %    FL/BPD:     80.0   %    71 - 87  FL:       63.9  mm     G. Age:  33w 0d         76  %    FL/AC:      21.2   %    20 - 24  Est. FW:    2170  gm    4 lb 13 oz      90  % ---------------------------------------------------------------------- OB History  Gravidity:    7         Term:   5        Prem:   0        SAB:   1  TOP:          0       Ectopic:  0        Jenkins: 5 ---------------------------------------------------------------------- Gestational Age  LMP:           31w 4d        Date:  10/25/21                  EDD:   08/01/22  U/S Today:     32w 5d                                        EDD:   07/24/22  Best:          31w 4d     Det. By:  LMP  (10/25/21)          EDD:   08/01/22 ---------------------------------------------------------------------- Anatomy  Cranium:               Appears normal         Stomach:                Appears normal, left                                                                        sided  Ventricles:            Appears normal         Kidneys:                Appear normal  Heart:                 Previously seen        Bladder:                 Appears normal  Diaphragm:             Appears normal  Other:  All anatomy previously visualized ---------------------------------------------------------------------- Cervix Uterus Adnexa  Adnexa  No abnormality visualized ---------------------------------------------------------------------- Impression  Patient returned for fetal growth assessment because of  uterine size date discrepancy found at your office  examination.  She does not have gestational diabetes.  Blood  pressure today at our office is 131/61 mmHg.  On today's ultrasound, the estimated fetal weight is at the  90th percentile and the abdominal circumference  measurement is at the 97th percentile. Mild polyhydramnios  is seen.  Cephalic presentation.  I reassured the patient of the finding that mild polyhydramnios  is usually idiopathic (no known cause) and is not associated  with fetal adverse outcomes. ---------------------------------------------------------------------- Recommendations  -An appointment was made for her to return in 4 weeks for  fetal growth and amniotic fluid assessments (for estimation of  fetal weight) ----------------------------------------------------------------------                 Tama High, MD Electronically Signed Final Report   06/03/2022 11:08 am ----------------------------------------------------------------------   Procedures Procedures    Medications Ordered in ED Medications  tetracaine (PONTOCAINE) 0.5 % ophthalmic solution 1 drop (has no administration in time range)  fluorescein ophthalmic strip 1 strip (has no administration in time range)    ED Course/ Medical Decision Making/ A&P                             Medical Decision Making  32 year old female presents to the ED due to erythema and edema to right eye that started yesterday.  Patient son recently had pinkeye.  Patient admits to foreign body sensation.  Upon arrival, stable vitals.  Patient no acute distress.  Injected  conjunctiva and edema to right eyelid.  No pain with EOMs.  Low suspicion for orbital cellulitis.  Possible preseptal cellulitis given eyelid edema.  Will perform fluorescein stain given foreign body sensation to rule out corneal abrasion vs. ulcer.   2:17 PM informed by RN that patient eloped prior to full eye exam.  There was delay in care given patient was originally placed in the hallway and I was unable to perform a fluorescein stain while in the hallway.  Patient was then placed in a room and did not wait for eye exam.   Has PCP       Final Clinical Impression(s) / ED Diagnoses Final diagnoses:  Eye irritation    Rx / DC Orders ED Discharge Orders     None         Karie Kirks 06/03/22 1421    Elgie Congo, MD 06/04/22 1737

## 2022-06-03 NOTE — ED Triage Notes (Addendum)
Pt states that yesterday she noticed her R eye swelling. Endorses some slight drainage from the area. Reports that son had pink eye approximately two weeks ago. Self tx at home with benadryl cream and abx eye drops starting yesterday. Pt denies vision changes. Of note, pt is [redacted] weeks pregnant.

## 2022-06-04 ENCOUNTER — Emergency Department (HOSPITAL_COMMUNITY)
Admission: EM | Admit: 2022-06-04 | Discharge: 2022-06-04 | Disposition: A | Discharge status: Home / Self Care | Payer: Medicaid Other | Attending: Emergency Medicine | Admitting: Emergency Medicine

## 2022-06-04 DIAGNOSIS — O99891 Other specified diseases and conditions complicating pregnancy: Secondary | ICD-10-CM | POA: Insufficient documentation

## 2022-06-04 DIAGNOSIS — H1031 Unspecified acute conjunctivitis, right eye: Secondary | ICD-10-CM | POA: Diagnosis not present

## 2022-06-04 MED ORDER — ERYTHROMYCIN 5 MG/GM OP OINT: TOPICAL_OINTMENT | OPHTHALMIC | 0 refills | Status: AC

## 2022-06-04 NOTE — Discharge Instructions (Signed)
As we discussed I think that the infection of your eye is likely due to a viral or allergic conjunctivitis but it would not hurt to use the antibiotic ointment drops, if you want to wait a few days to see if it goes away on its own this is fine, otherwise you need to apply the drops 4 times daily to the affected eye for at least 1 week.

## 2022-06-04 NOTE — ED Notes (Signed)
Pt d/c home per MD order,discharge summary reviewed pt verbalizes understanding.

## 2022-06-04 NOTE — ED Triage Notes (Signed)
Pt to ED c/o right eye problem, reports came here yesterday for the same, but left after triage assessment, reports left prior to getting eye drop rx.  Reports son had pink eye. Pt is [redacted] weeks pregnant.

## 2022-06-04 NOTE — ED Provider Notes (Signed)
[facility] Provider Note   CSN: [id number] Arrival date & time: 06/04/22  1844     History  Chief Complaint  Patient presents with   Eye Problem    right    Yvonne Jenkins is a 32 y.o. female with noncontributory past medical history who presents with concern for conjunctivitis of the right eye.  Patient was seen evaluated yesterday, at that time there was some concern for preseptal cellulitis, corneal abrasion, versus other as patient had some more eyelid swelling, and felt irritation of the eye itself.  Patient reports that she left before eye exam, but returns today for treatment.  Patient reports that she does not wish to have a fluorescein exam as the eye irritation and eyelid swelling is resolved, she only wants treatment for bacterial conjunctivitis.  Patient reports that one of her sons had bacterial conjunctivitis and she think she got it from him.  She is notably [redacted] weeks pregnant.   Eye Problem      Home Medications Prior to Admission medications   Medication Sig Start Date End Date Taking? Authorizing Provider  erythromycin ophthalmic ointment Place a 1/2 inch ribbon of ointment into the lower eyelid 4x daily for 7 days 06/04/22  Yes Zaydin Billey H, PA-C  albuterol (VENTOLIN HFA) 108 (90 Base) MCG/ACT inhaler Inhale 2 puffs into the lungs every 2 (two) hours as needed for wheezing or shortness of breath (cough). 02/10/22   Domenic Moras, PA-C  Blood Pressure Monitoring DEVI 1 each by Does not apply route once a week. Patient not taking: Reported on 02/22/2022 12/29/21   Autry-Lott, Naaman Plummer, DO  cetirizine (ZYRTEC) 10 MG tablet Take 1 tablet (10 mg total) by mouth daily. Patient not taking: Reported on 02/22/2022 10/20/21   Dorna Mai, MD  ferrous fumarate (HEMOCYTE - 106 MG FE) 325 (106 Fe) MG TABS tablet Take 1 tablet (106 mg of iron total) by mouth every other day. Patient not taking: Reported on 06/01/2022 05/20/22    Darliss Cheney, MD  fluticasone (FLOVENT HFA) 44 MCG/ACT inhaler Inhale 2 puffs into the lungs 2 (two) times daily. 04/27/22   Tresea Mall, CNM  predniSONE (DELTASONE) 20 MG tablet Take 2 tablets (40 mg total) by mouth daily. Patient not taking: Reported on 05/13/2022 04/26/22   Antonietta Breach, PA-C  Prenatal Vit-Fe Fumarate-FA (MULTIVITAMIN-PRENATAL) 27-0.8 MG TABS tablet Take 1 tablet by mouth daily at 12 noon.    [provider]      Allergies    Patient has no known allergies.    Review of Systems   Review of Systems  All other systems reviewed and are negative.   Physical Exam Updated Vital Signs BP 129/76 (BP Location: Right Arm)   Pulse 93   Temp 97.8 F (36.6 C) (Oral)   Resp 18   Ht '5\' 7"'$  (1.702 m)   Wt 95.7 kg   LMP 10/25/2021   SpO2 99%   BMI 33.05 kg/m  Physical Exam Vitals and nursing note reviewed.  Constitutional:      General: She is not in acute distress.    Appearance: Normal appearance.  HENT:     Head: Normocephalic and atraumatic.  Eyes:     General:        Right eye: No discharge.        Left eye: No discharge.     Comments: Patient with some irritation of the conjunctive, with redness, but normal extraocular movements, pupils are equal  round reactive to light.  She has no significant swelling of the eyelids themselves, and no pain with extraocular movement on my exam.  Cardiovascular:     Rate and Rhythm: Normal rate and regular rhythm.  Pulmonary:     Effort: Pulmonary effort is normal. No respiratory distress.  Musculoskeletal:        General: No deformity.  Skin:    General: Skin is warm and dry.  Neurological:     Mental Status: She is alert and oriented to person, place, and time.  Psychiatric:        Mood and Affect: Mood normal.        Behavior: Behavior normal.     ED Results / Procedures / Treatments   Labs (all labs ordered are listed, but only abnormal results are displayed) Labs Reviewed - No data to  display  EKG None  Radiology [place] MFM OB FOLLOW UP  Result Date: 06/03/2022 ----------------------------------------------------------------------  OBSTETRICS REPORT                       (Signed Final 06/03/2022 11:08 am) ---------------------------------------------------------------------- Patient Info  ID #:       [id number]                          D.O.B.:  1990-11-05 (31 yrs)  Name:       Yvonne Jenkins                   Visit Date: 06/03/2022 10:22 am ---------------------------------------------------------------------- Performed By  Attending:        Tama High MD        Ref. Address:     [place], [place]  Performed By:     Rosana Hoes          [facility]                                                             [facility]  Referred By:      Donnamae Jude                    MD ---------------------------------------------------------------------- Orders  #  Description                           Code        Ordered By  1  [place] MFM OB FOLLOW UP                   [id number]    Darron Doom ----------------------------------------------------------------------  #  Order #                     Accession #                Episode #  1  [id number]                   [id number]                 [id number] ---------------------------------------------------------------------- Indications  Uterine size-date discrepancy, third trimester O26.843  Polyhydramnios, third trimester, antepartum    O40.3XX0  condition or complication, unspecified fetus  Grand multiparity, antepartum                  O09.40  Herpes simplex virus (HSV)                     O98.519 B00.9  Genetic carrier Electronics engineer Thal)    Z14.8  [redacted]  weeks gestation of pregnancy                Z3A.31  LR NIPS - Female, Negative AFP ---------------------------------------------------------------------- Fetal Evaluation  Num Of Fetuses:         1  Fetal Heart Rate(bpm):  144  Cardiac Activity:       Observed  Presentation:           Cephalic  Placenta:               Posterior  P. Cord Insertion:      Previously visualized  Amniotic Fluid  AFI FV:      Polyhydramnios  AFI Sum(cm)     %Tile       Largest Pocket(cm)  24.98           96          7.98  RUQ(cm)       RLQ(cm)       LUQ(cm)        LLQ(cm)  5.77          6.24          7.98           4.99 ---------------------------------------------------------------------- Biometry  BPD:      79.9  mm     G. Age:  32w 1d         56  %    CI:        77.12   %    70 - 86                                                          FL/HC:  22.2   %    19.1 - 21.3  HC:      288.1  mm     G. Age:  31w 5d         17  %    HC/AC:      0.96        0.96 - 1.17  AC:       301   mm     G. Age:  34w 0d         97  %    FL/BPD:     80.0   %    71 - 87  FL:       63.9  mm     G. Age:  33w 0d         76  %    FL/AC:      21.2   %    20 - 24  Est. FW:    2170  gm    4 lb 13 oz      90  % ---------------------------------------------------------------------- OB History  Gravidity:    7         Term:   5        Prem:   0        SAB:   1  TOP:          0       Ectopic:  0        Jenkins: 5 ---------------------------------------------------------------------- Gestational Age  LMP:           31w 4d        Date:  10/25/21                  EDD:   08/01/22  U/S Today:     32w 5d                                        EDD:   07/24/22  Best:          31w 4d     Det. By:  LMP  (10/25/21)          EDD:   08/01/22 ---------------------------------------------------------------------- Anatomy  Cranium:               Appears normal         Stomach:                Appears normal, left                                                                         sided  Ventricles:            Appears normal         Kidneys:                Appear normal  Heart:                 Previously seen        Bladder:                Appears normal  Diaphragm:             Appears normal  Other:  All anatomy previously visualized ---------------------------------------------------------------------- Cervix Uterus Adnexa  Adnexa  No abnormality visualized ---------------------------------------------------------------------- Impression  Patient returned for fetal growth assessment because of  uterine size date discrepancy found at your office  examination.  She does not have gestational diabetes.  Blood  pressure today at our office is 131/61 mmHg.  On today's ultrasound, the estimated fetal weight is at the  90th percentile and the abdominal circumference  measurement is at the 97th percentile. Mild polyhydramnios  is seen.  Cephalic presentation.  I reassured the patient of the finding that mild polyhydramnios  is usually idiopathic (no known cause) and is not associated  with fetal adverse outcomes. ---------------------------------------------------------------------- Recommendations  -An appointment was made for her to return in 4 weeks for  fetal growth and amniotic fluid assessments (for estimation of  fetal weight) ----------------------------------------------------------------------                 Tama High, MD Electronically Signed Final Report   06/03/2022 11:08 am ----------------------------------------------------------------------   Procedures Procedures    Medications Ordered in ED Medications - No data to display  ED Course/ Medical Decision Making/ A&P                             Medical Decision Making  This patient is a 32 y.o. female who presents to the ED for concern of pinkeye, eye irritation, she was seen and left without completing treatment yesterday with concern for possible developing preseptal cellulitis, or corneal abrasion or irritation.    Differential diagnoses prior to evaluation: Allergic, bacterial, or viral conjunctivitis.  Less concern today for preseptal, orbital cellulitis, corneal abrasion, irritation  Past Medical History / Social History / Additional history: Chart reviewed. Pertinent results include: [redacted] weeks pregnant  Physical Exam: Physical exam performed. The pertinent findings include: With some irritation of the conjunctive of the right eye but no pain with extraocular movements, no purulent discharge noted on my exam.  She does not have significant chemosis or preseptal swelling noted on yesterday's exam.  She denies any irritation of the eye itself on my exam.  Medications / Treatment: Discussed with patient that despite having a son with reported bacterial conjunctivitis her symptoms to me seem most consistent with viral or allergic conjunctivitis, I recommend that she refrain from treating at this time but do think it is reasonable to provide her with an as needed Romycin ointment to use if her symptoms continue after not treating for several days   Disposition: After consideration of the diagnostic results and the patients response to treatment, I feel that conjunctivitis of unclear etiology, likely viral or allergic, possibly bacterial however.   emergency department workup does not suggest an emergent condition requiring admission or immediate intervention beyond what has been performed at this time. The plan is: as above. The patient is safe for discharge and has been instructed to return immediately for worsening symptoms, change in symptoms or any other concerns.  Final Clinical Impression(s) / ED Diagnoses Final diagnoses:  Acute conjunctivitis of right eye, unspecified acute conjunctivitis type    Rx / DC Orders ED Discharge Orders          Ordered    erythromycin ophthalmic ointment        06/04/22 1901              Nakira Litzau,  Jackalyn Lombard 06/04/22 Creed Copper,  MD 06/04/22 2330

## 2022-06-10 DIAGNOSIS — J45909 Unspecified asthma, uncomplicated: Secondary | ICD-10-CM | POA: Insufficient documentation

## 2022-06-15 ENCOUNTER — Ambulatory Visit (INDEPENDENT_AMBULATORY_CARE_PROVIDER_SITE_OTHER): Payer: Medicaid Other | Admitting: Obstetrics and Gynecology

## 2022-06-15 ENCOUNTER — Other Ambulatory Visit: Payer: Self-pay

## 2022-06-15 VITALS — BP 132/78 | HR 91 | Wt 210.5 lb

## 2022-06-15 DIAGNOSIS — Z3A33 33 weeks gestation of pregnancy: Secondary | ICD-10-CM

## 2022-06-15 DIAGNOSIS — Z8619 Personal history of other infectious and parasitic diseases: Secondary | ICD-10-CM

## 2022-06-15 DIAGNOSIS — Z3480 Encounter for supervision of other normal pregnancy, unspecified trimester: Secondary | ICD-10-CM

## 2022-06-15 DIAGNOSIS — O403XX0 Polyhydramnios, third trimester, not applicable or unspecified: Secondary | ICD-10-CM | POA: Insufficient documentation

## 2022-06-15 MED ORDER — BLOOD PRESSURE MONITORING DEVI: 1.0000 | 0 refills | Status: AC

## 2022-06-15 NOTE — Progress Notes (Signed)
   PRENATAL VISIT NOTE  Subjective:  Yvonne Jenkins is a 32 y.o. [id number] at 44w2dbeing seen today for ongoing prenatal care.  She is currently monitored for the following issues for this high-risk pregnancy and has Genital herpes simplex; Supervision of other normal pregnancy, antepartum; Cervical polyp; History of herpes genitalis; Fundal height high for dates; and Polyhydramnios affecting pregnancy in third trimester on their problem list.  Pt denies headache, visual changes and RUQ pain.  BP high normal on recheck.  Patient doing well with no acute concerns today. She reports no complaints.  Contractions: Not present. Vag. Bleeding: None.  Movement: Present. Denies leaking of fluid.   The following portions of the patient's history were reviewed and updated as appropriate: allergies, current medications, past family history, past medical history, past social history, past surgical history and problem list. Problem list updated.  Objective:   Vitals:   06/15/22 1527 06/15/22 1605 06/15/22 1608  BP: (!) 145/83 132/78 132/78  Pulse: 93 91   Weight: 210 lb 8 oz (95.5 kg)      Fetal Status: Fetal Heart Rate (bpm): 143 Fundal Height: 36 cm Movement: Present     General:  Alert, oriented and cooperative. Patient is in no acute distress.  Skin: Skin is warm and dry. No rash noted.   Cardiovascular: Normal heart rate noted  Respiratory: Normal respiratory effort, no problems with respiration noted  Abdomen: Soft, gravid, appropriate for gestational age.  Pain/Pressure: Absent     Pelvic: Cervical exam deferred        Extremities: Normal range of motion.  Edema: Trace  Mental Status:  Normal mood and affect. Normal behavior. Normal judgment and thought content.   Assessment and Plan:  Pregnancy: [id number] 354w2d1. [redacted] weeks gestation of pregnancy   2. Supervision of other normal pregnancy, antepartum Continue routine prenatal care  3. History of herpes genitalis Prophylaxis at 36  weeks  4. Polyhydramnios affecting pregnancy in third trimester AFI 25, pt has recheck on 07/08/22  Preterm labor symptoms and general obstetric precautions including but not limited to vaginal bleeding, contractions, leaking of fluid and fetal movement were reviewed in detail with the patient.  Please refer to After Visit Summary for other counseling recommendations.   Return in about 2 weeks (around 06/29/2022) [place].   LaLynnda ShieldsMD Faculty Attending Center [facility]

## 2022-06-15 NOTE — Addendum Note (Signed)
Addended byMariane Baumgarten on: 06/15/2022 04:11 PM   Modules accepted: Orders

## 2022-07-06 ENCOUNTER — Other Ambulatory Visit (HOSPITAL_COMMUNITY)
Admission: RE | Admit: 2022-07-06 | Discharge: 2022-07-06 | Disposition: A | Discharge status: Home / Self Care | Payer: Medicaid Other | Source: Ambulatory Visit | Attending: Family Medicine | Admitting: Family Medicine

## 2022-07-06 ENCOUNTER — Ambulatory Visit (INDEPENDENT_AMBULATORY_CARE_PROVIDER_SITE_OTHER): Payer: Medicaid Other | Admitting: Family Medicine

## 2022-07-06 ENCOUNTER — Other Ambulatory Visit: Payer: Self-pay

## 2022-07-06 VITALS — BP 140/90 | HR 103 | Wt 213.0 lb

## 2022-07-06 DIAGNOSIS — O403XX0 Polyhydramnios, third trimester, not applicable or unspecified: Secondary | ICD-10-CM | POA: Insufficient documentation

## 2022-07-06 DIAGNOSIS — Z8619 Personal history of other infectious and parasitic diseases: Secondary | ICD-10-CM

## 2022-07-06 DIAGNOSIS — Z3480 Encounter for supervision of other normal pregnancy, unspecified trimester: Secondary | ICD-10-CM | POA: Insufficient documentation

## 2022-07-06 DIAGNOSIS — Z3A36 36 weeks gestation of pregnancy: Secondary | ICD-10-CM

## 2022-07-06 MED ORDER — VALACYCLOVIR HCL 500 MG PO TABS: 500.0000 mg | ORAL_TABLET | Freq: Two times a day (BID) | ORAL | 1 refills | Status: AC

## 2022-07-07 ENCOUNTER — Telehealth: Payer: Self-pay | Admitting: Family Medicine

## 2022-07-07 LAB — CERVICOVAGINAL ANCILLARY ONLY
Bacterial Vaginitis (gardnerella): POSITIVE — AB
Chlamydia: NEGATIVE
Comment: NEGATIVE
Comment: NEGATIVE
Comment: NEGATIVE
Comment: NORMAL
Neisseria Gonorrhea: NEGATIVE
Trichomonas: NEGATIVE

## 2022-07-07 MED ORDER — METRONIDAZOLE 500 MG PO TABS: 500.0000 mg | ORAL_TABLET | Freq: Two times a day (BID) | ORAL | 0 refills | Status: DC

## 2022-07-07 NOTE — Telephone Encounter (Signed)
Called patient to discuss swab results.  Positive for bacterial vaginosis.  Sent prescription for metronidazole to patient's pharmacy.  No questions or concerns.

## 2022-07-07 NOTE — Progress Notes (Signed)
   PRENATAL VISIT NOTE  Subjective:  Yvonne Jenkins is a 32 y.o. [id number] at [redacted]w[redacted]d being seen today for ongoing prenatal care.  She is currently monitored for the following issues for this high-risk pregnancy and has Genital herpes simplex; Supervision of other normal pregnancy, antepartum; Cervical polyp; History of herpes genitalis; Fundal height high for dates; and Polyhydramnios affecting pregnancy in third trimester on their problem list.  Patient reports no cramping, no leaking, and occasional contractions.  Contractions: Not present. Vag. Bleeding: None.  Movement: Present. Denies leaking of fluid.   The following portions of the patient's history were reviewed and updated as appropriate: allergies, current medications, past family history, past medical history, past social history, past surgical history and problem list.   Objective:   Vitals:   07/06/22 1626  BP: (!) 140/90  Pulse: (!) 103  Weight: 213 lb (96.6 kg)    Fetal Status: Fetal Heart Rate (bpm): 134   Movement: Present     General:  Alert, oriented and cooperative. Patient is in no acute distress.  Skin: Skin is warm and dry. No rash noted.   Cardiovascular: Normal heart rate noted  Respiratory: Normal respiratory effort, no problems with respiration noted  Abdomen: Soft, gravid, appropriate for gestational age.  Pain/Pressure: Absent     Pelvic: Cervical exam deferred        Extremities: Normal range of motion.     Mental Status: Normal mood and affect. Normal behavior. Normal judgment and thought content.   Assessment and Plan:  Pregnancy: [id number] at [redacted]w[redacted]d 1. Supervision of other normal pregnancy, antepartum Continue routine prenatal care - Culture, beta strep (group b only) - Cervicovaginal ancillary only( [place])  2. History of herpes genitalis Valtrex sent to patient's pharmacy - Culture, beta strep (group b only) - Cervicovaginal ancillary only( [place])  3. Polyhydramnios affecting pregnancy  in third trimester Last AFI 25  - Culture, beta strep (group b only) - Cervicovaginal ancillary only( [place])  Preterm labor symptoms and general obstetric precautions including but not limited to vaginal bleeding, contractions, leaking of fluid and fetal movement were reviewed in detail with the patient. Please refer to After Visit Summary for other counseling recommendations.   No follow-ups on file.  Future Appointments  Date Time Provider [place]  07/08/2022 11:15 AM [facility] NURSE [facility]  07/08/2022 11:30 AM [facility]  07/16/2022  8:35 AM Left[place]-Kirby, Kathie Dike, CNM [facility]    Concepcion Living, MD

## 2022-07-08 ENCOUNTER — Ambulatory Visit: Payer: Medicaid Other | Admitting: *Deleted

## 2022-07-08 ENCOUNTER — Ambulatory Visit: Payer: Medicaid Other | Attending: Obstetrics and Gynecology

## 2022-07-08 ENCOUNTER — Other Ambulatory Visit: Payer: Self-pay | Admitting: Obstetrics and Gynecology

## 2022-07-08 VITALS — BP 148/82 | HR 111

## 2022-07-08 DIAGNOSIS — Z3689 Encounter for other specified antenatal screening: Secondary | ICD-10-CM

## 2022-07-08 DIAGNOSIS — O409XX0 Polyhydramnios, unspecified trimester, not applicable or unspecified: Secondary | ICD-10-CM | POA: Insufficient documentation

## 2022-07-08 DIAGNOSIS — O26843 Uterine size-date discrepancy, third trimester: Secondary | ICD-10-CM | POA: Insufficient documentation

## 2022-07-08 DIAGNOSIS — O403XX0 Polyhydramnios, third trimester, not applicable or unspecified: Secondary | ICD-10-CM

## 2022-07-08 DIAGNOSIS — B009 Herpesviral infection, unspecified: Secondary | ICD-10-CM

## 2022-07-08 DIAGNOSIS — D563 Thalassemia minor: Secondary | ICD-10-CM

## 2022-07-08 DIAGNOSIS — O0943 Supervision of pregnancy with grand multiparity, third trimester: Secondary | ICD-10-CM | POA: Insufficient documentation

## 2022-07-08 DIAGNOSIS — O133 Gestational [pregnancy-induced] hypertension without significant proteinuria, third trimester: Secondary | ICD-10-CM | POA: Diagnosis not present

## 2022-07-08 DIAGNOSIS — Z3A36 36 weeks gestation of pregnancy: Secondary | ICD-10-CM

## 2022-07-08 DIAGNOSIS — O98513 Other viral diseases complicating pregnancy, third trimester: Secondary | ICD-10-CM

## 2022-07-08 NOTE — Progress Notes (Signed)
Alert received through NCNotify system on 07/05/22 as follows: Patient identified as not having a recommended prenatal visit. Patient is 36 weeks and 1 days pregnant. Most recent prenatal visit was on 06/15/2022 at [place] for [place] at [facility].  Chart review: Patient's last visit was on 07/06/22 following receipt of alert. Will return 07/16/22.  No action needed.  Annabell Howells, RN 07/08/2022  10:38 AM

## 2022-07-10 LAB — CULTURE, BETA STREP (GROUP B ONLY): Strep Gp B Culture: NEGATIVE

## 2022-07-14 ENCOUNTER — Telehealth (HOSPITAL_BASED_OUTPATIENT_CLINIC_OR_DEPARTMENT_OTHER): Payer: Self-pay | Admitting: Advanced Practice Midwife

## 2022-07-14 NOTE — Telephone Encounter (Signed)
I called patient, who is scheduled for prenatal visit on Friday, 07/16/22, at [redacted]w[redacted]d.  I verified pt identity with 2 markers.  Pt blood pressure was elevated on 06/15/22 in the office and on 07/08/22 with MFM.  Pt denied any s/sx of PEC on 3/28 and does not have symptoms today either.  Pt has her home cuff and took a blood pressure 2 days ago, which she reports was [id number] systolic but she does not remember the diastolic number. I discussed options with patient, including keeping her appointment on Friday and bringing a log of blood pressures at home until then, or scheduling her IOL at 37 weeks as she does meet criteria for GHTN at this time.  Discussed risks of HTN in pregnancy.  Pt declines IOL and will record home blood pressures and bring to the office on Friday 3/5.  PEC s/sx, reasons to go to [facility] reviewed.

## 2022-07-16 ENCOUNTER — Other Ambulatory Visit: Payer: Self-pay | Admitting: Advanced Practice Midwife

## 2022-07-16 ENCOUNTER — Ambulatory Visit (INDEPENDENT_AMBULATORY_CARE_PROVIDER_SITE_OTHER): Payer: Medicaid Other | Admitting: Advanced Practice Midwife

## 2022-07-16 ENCOUNTER — Encounter: Payer: Self-pay | Admitting: Advanced Practice Midwife

## 2022-07-16 ENCOUNTER — Other Ambulatory Visit: Payer: Self-pay

## 2022-07-16 VITALS — BP 136/88 | HR 108 | Wt 212.3 lb

## 2022-07-16 DIAGNOSIS — O133 Gestational [pregnancy-induced] hypertension without significant proteinuria, third trimester: Secondary | ICD-10-CM | POA: Insufficient documentation

## 2022-07-16 DIAGNOSIS — O0993 Supervision of high risk pregnancy, unspecified, third trimester: Secondary | ICD-10-CM

## 2022-07-16 DIAGNOSIS — Z3A37 37 weeks gestation of pregnancy: Secondary | ICD-10-CM

## 2022-07-16 NOTE — Progress Notes (Signed)
Alert received on 07/15/2022 through NCNotify system as follows: Patient identified as having high BP reading (148/82) on 07/08/2022  Chart review: Blood pressure was rechecked on 07/16/2022  No action needed. Blood pressure recheck was 136/88.  Lowry Bowl, CMA 07/16/2022  12:46 PM

## 2022-07-16 NOTE — Progress Notes (Signed)
   PRENATAL VISIT NOTE  Subjective:  Yvonne Jenkins is a 32 y.o. [id number] at [redacted]w[redacted]d being seen today for ongoing prenatal care.  She is currently monitored for the following issues for this high-risk pregnancy and has Genital herpes simplex; Supervision of other normal pregnancy, antepartum; Cervical polyp; History of herpes genitalis; Fundal height high for dates; and Polyhydramnios affecting pregnancy in third trimester on their problem list.  Patient reports occasional contractions.  Contractions: Not present. Vag. Bleeding: None.  Movement: Present. Denies leaking of fluid.   The following portions of the patient's history were reviewed and updated as appropriate: allergies, current medications, past family history, past medical history, past social history, past surgical history and problem list.   Objective:   Vitals:   07/16/22 0912 07/16/22 0917  BP: (!) 129/91 136/88  Pulse: (!) 111 (!) 108  Weight: 212 lb 4.8 oz (96.3 kg)     Fetal Status: Fetal Heart Rate (bpm): 143 Fundal Height: 37 cm Movement: Present  Presentation: Vertex  General:  Alert, oriented and cooperative. Patient is in no acute distress.  Skin: Skin is warm and dry. No rash noted.   Cardiovascular: Normal heart rate noted  Respiratory: Normal respiratory effort, no problems with respiration noted  Abdomen: Soft, gravid, appropriate for gestational age.  Pain/Pressure: Present     Pelvic: Cervical exam performed in the presence of a chaperone Dilation: 2.5 Effacement (%): 70 Station: -2  Extremities: Normal range of motion.  Edema: None  Mental Status: Normal mood and affect. Normal behavior. Normal judgment and thought content.   Assessment and Plan:  Pregnancy: [id number] at [redacted]w[redacted]d 1. Supervision of other normal pregnancy, antepartum --Anticipatory guidance about next visits/weeks of pregnancy given.   2. Gestational hypertension, third trimester --Pt met criteria for GHTN with recent MFM visit. I called pt on  07/14/22 to discuss this and offer 37 week IOL (see telephone note). Pt declined IOL. --Discussed today, and pt first BP 120s/90s, retake wnl.   --No s/sx of PEC --Pt does not want induction until her mom is in town Sunday night but is Ok with induction at that time. --Discussed reasons for IOL with HTN, including prevention of stillbirth. Pt states understanding. --Pt to take home BP and go in if persistently elevated or if PEC symptoms develop --Fetal kick counts reviewed --IOL scheduled for Monday, 07/19/22 and orders placed.  Term labor symptoms and general obstetric precautions including but not limited to vaginal bleeding, contractions, leaking of fluid and fetal movement were reviewed in detail with the patient. Please refer to After Visit Summary for other counseling recommendations.   Return for IOL on 07/19/22.  Future Appointments  Date Time Provider Department Center  07/19/2022  6:30 AM [facility] SCHED ROOM [facility] None  07/23/2022  9:55 AM Reva Bores, MD [facility]  07/30/2022 10:15 AM Carlynn Herald, CNM [facility]  08/06/2022  8:55 AM Marny Lowenstein, PA-C [facility]    Sharen Counter, CNM

## 2022-07-17 LAB — CBC
Hematocrit: 32.1 % — ABNORMAL LOW (ref 34.0–46.6)
Hemoglobin: 10.5 g/dL — ABNORMAL LOW (ref 11.1–15.9)
MCH: 23.6 pg — ABNORMAL LOW (ref 26.6–33.0)
MCHC: 32.7 g/dL (ref 31.5–35.7)
MCV: 72 fL — ABNORMAL LOW (ref 79–97)
Platelets: 141 10*3/uL — ABNORMAL LOW (ref 150–450)
RBC: 4.44 x10E6/uL (ref 3.77–5.28)
RDW: 16.1 % — ABNORMAL HIGH (ref 11.7–15.4)
WBC: 10.5 10*3/uL (ref 3.4–10.8)

## 2022-07-17 LAB — COMPREHENSIVE METABOLIC PANEL
ALT: 16 IU/L (ref 0–32)
AST: 19 IU/L (ref 0–40)
Albumin/Globulin Ratio: 1.4 (ref 1.2–2.2)
Albumin: 3.5 g/dL — ABNORMAL LOW (ref 3.9–4.9)
Alkaline Phosphatase: 95 IU/L (ref 44–121)
BUN/Creatinine Ratio: 15 (ref 9–23)
BUN: 9 mg/dL (ref 6–20)
Bilirubin Total: 0.2 mg/dL (ref 0.0–1.2)
CO2: 18 mmol/L — ABNORMAL LOW (ref 20–29)
Calcium: 8.9 mg/dL (ref 8.7–10.2)
Chloride: 102 mmol/L (ref 96–106)
Creatinine, Ser: 0.62 mg/dL (ref 0.57–1.00)
Globulin, Total: 2.5 g/dL (ref 1.5–4.5)
Glucose: 70 mg/dL (ref 70–99)
Potassium: 3.8 mmol/L (ref 3.5–5.2)
Sodium: 137 mmol/L (ref 134–144)
Total Protein: 6 g/dL (ref 6.0–8.5)
eGFR: 121 mL/min/{1.73_m2} (ref >59)

## 2022-07-17 LAB — PROTEIN / CREATININE RATIO, URINE
Creatinine, Urine: 216.2 mg/dL
Protein, Ur: 29.7 mg/dL
Protein/Creat Ratio: 137 mg/g creat (ref 0–200)

## 2022-07-19 ENCOUNTER — Inpatient Hospital Stay (HOSPITAL_COMMUNITY)
Admission: RE | Admit: 2022-07-19 | Discharge: 2022-07-20 | DRG: 806 | Disposition: A | Discharge status: Home / Self Care | Payer: Medicaid Other | Attending: Family Medicine | Admitting: Family Medicine

## 2022-07-19 ENCOUNTER — Other Ambulatory Visit: Payer: Self-pay

## 2022-07-19 ENCOUNTER — Inpatient Hospital Stay (HOSPITAL_COMMUNITY): Payer: Medicaid Other | Admitting: Anesthesiology

## 2022-07-19 ENCOUNTER — Other Ambulatory Visit (HOSPITAL_COMMUNITY): Payer: Self-pay

## 2022-07-19 ENCOUNTER — Inpatient Hospital Stay (HOSPITAL_COMMUNITY): Payer: Medicaid Other

## 2022-07-19 ENCOUNTER — Encounter (HOSPITAL_COMMUNITY): Payer: Self-pay | Admitting: Family Medicine

## 2022-07-19 DIAGNOSIS — Z87891 Personal history of nicotine dependence: Secondary | ICD-10-CM

## 2022-07-19 DIAGNOSIS — O134 Gestational [pregnancy-induced] hypertension without significant proteinuria, complicating childbirth: Principal | ICD-10-CM | POA: Diagnosis present

## 2022-07-19 DIAGNOSIS — O133 Gestational [pregnancy-induced] hypertension without significant proteinuria, third trimester: Secondary | ICD-10-CM

## 2022-07-19 DIAGNOSIS — Z3A38 38 weeks gestation of pregnancy: Secondary | ICD-10-CM | POA: Diagnosis not present

## 2022-07-19 DIAGNOSIS — A6000 Herpesviral infection of urogenital system, unspecified: Secondary | ICD-10-CM | POA: Diagnosis present

## 2022-07-19 DIAGNOSIS — O0990 Supervision of high risk pregnancy, unspecified, unspecified trimester: Secondary | ICD-10-CM

## 2022-07-19 DIAGNOSIS — D696 Thrombocytopenia, unspecified: Secondary | ICD-10-CM | POA: Diagnosis present

## 2022-07-19 DIAGNOSIS — O9912 Other diseases of the blood and blood-forming organs and certain disorders involving the immune mechanism complicating childbirth: Secondary | ICD-10-CM | POA: Diagnosis present

## 2022-07-19 DIAGNOSIS — D508 Other iron deficiency anemias: Secondary | ICD-10-CM

## 2022-07-19 DIAGNOSIS — O9902 Anemia complicating childbirth: Secondary | ICD-10-CM | POA: Diagnosis present

## 2022-07-19 DIAGNOSIS — O9832 Other infections with a predominantly sexual mode of transmission complicating childbirth: Secondary | ICD-10-CM | POA: Diagnosis present

## 2022-07-19 DIAGNOSIS — Z8619 Personal history of other infectious and parasitic diseases: Secondary | ICD-10-CM | POA: Diagnosis present

## 2022-07-19 LAB — COMPREHENSIVE METABOLIC PANEL
ALT: 13 U/L (ref 0–44)
AST: 20 U/L (ref 15–41)
Albumin: 2.6 g/dL — ABNORMAL LOW (ref 3.5–5.0)
Alkaline Phosphatase: 76 U/L (ref 38–126)
Anion gap: 10 (ref 5–15)
BUN: 10 mg/dL (ref 6–20)
CO2: 19 mmol/L — ABNORMAL LOW (ref 22–32)
Calcium: 8.2 mg/dL — ABNORMAL LOW (ref 8.9–10.3)
Chloride: 105 mmol/L (ref 98–111)
Creatinine, Ser: 0.56 mg/dL (ref 0.44–1.00)
GFR, Estimated: >60 mL/min (ref >60)
Glucose, Bld: 85 mg/dL (ref 70–99)
Potassium: 3.4 mmol/L — ABNORMAL LOW (ref 3.5–5.1)
Sodium: 134 mmol/L — ABNORMAL LOW (ref 135–145)
Total Bilirubin: 0.6 mg/dL (ref 0.3–1.2)
Total Protein: 5.8 g/dL — ABNORMAL LOW (ref 6.5–8.1)

## 2022-07-19 LAB — CBC
HCT: 31 % — ABNORMAL LOW (ref 36.0–46.0)
Hemoglobin: 9.9 g/dL — ABNORMAL LOW (ref 12.0–15.0)
MCH: 23.5 pg — ABNORMAL LOW (ref 26.0–34.0)
MCHC: 31.9 g/dL (ref 30.0–36.0)
MCV: 73.5 fL — ABNORMAL LOW (ref 80.0–100.0)
Platelets: 142 10*3/uL — ABNORMAL LOW (ref 150–400)
RBC: 4.22 MIL/uL (ref 3.87–5.11)
RDW: 17.1 % — ABNORMAL HIGH (ref 11.5–15.5)
WBC: 8.8 10*3/uL (ref 4.0–10.5)
nRBC: 0 % (ref 0.0–0.2)

## 2022-07-19 LAB — RPR: RPR Ser Ql: NONREACTIVE

## 2022-07-19 LAB — PROTEIN / CREATININE RATIO, URINE
Creatinine, Urine: 141 mg/dL
Protein Creatinine Ratio: 0.1 mg/mg{Cre} (ref 0.00–0.15)
Total Protein, Urine: 14 mg/dL

## 2022-07-19 LAB — TYPE AND SCREEN
ABO/RH(D): A POS
Antibody Screen: NEGATIVE

## 2022-07-19 MED ORDER — LIDOCAINE-EPINEPHRINE (PF) 2 %-1:200000 IJ SOLN
INTRAMUSCULAR | Status: DC | PRN
  Administered 2022-07-19: 5 mL via EPIDURAL

## 2022-07-19 MED ORDER — SOD CITRATE-CITRIC ACID 500-334 MG/5ML PO SOLN: 30.0000 mL | ORAL | Status: DC | PRN

## 2022-07-19 MED ORDER — ZOLPIDEM TARTRATE 5 MG PO TABS: 5.0000 mg | ORAL_TABLET | Freq: Every evening | ORAL | Status: DC | PRN

## 2022-07-19 MED ORDER — EPHEDRINE 5 MG/ML INJ: 10.0000 mg | INTRAVENOUS | Status: DC | PRN

## 2022-07-19 MED ORDER — OXYCODONE-ACETAMINOPHEN 5-325 MG PO TABS: 2.0000 | ORAL_TABLET | ORAL | Status: DC | PRN

## 2022-07-19 MED ORDER — DIPHENHYDRAMINE HCL 25 MG PO CAPS: 25.0000 mg | ORAL_CAPSULE | Freq: Four times a day (QID) | ORAL | Status: DC | PRN

## 2022-07-19 MED ORDER — OXYCODONE HCL 5 MG PO TABS: 5.0000 mg | ORAL_TABLET | ORAL | Status: DC | PRN

## 2022-07-19 MED ORDER — TRANEXAMIC ACID-NACL 1000-0.7 MG/100ML-% IV SOLN: 1000.0000 mg | Freq: Once | INTRAVENOUS | Status: DC | PRN

## 2022-07-19 MED ORDER — ONDANSETRON HCL 4 MG PO TABS: 4.0000 mg | ORAL_TABLET | ORAL | Status: DC | PRN

## 2022-07-19 MED ORDER — BENZOCAINE-MENTHOL 20-0.5 % EX AERO: 1.0000 | INHALATION_SPRAY | CUTANEOUS | Status: DC | PRN

## 2022-07-19 MED ORDER — LACTATED RINGERS IV SOLN: INTRAVENOUS | Status: DC

## 2022-07-19 MED ORDER — PHENYLEPHRINE 80 MCG/ML (10ML) SYRINGE FOR IV PUSH (FOR BLOOD PRESSURE SUPPORT): 80.0000 ug | PREFILLED_SYRINGE | INTRAVENOUS | Status: DC | PRN

## 2022-07-19 MED ORDER — FENTANYL-BUPIVACAINE-NACL 0.5-0.125-0.9 MG/250ML-% EP SOLN
12.0000 mL/h | EPIDURAL | Status: DC | PRN
  Administered 2022-07-19: 12 mL/h via EPIDURAL
  Filled 2022-07-19: qty 250

## 2022-07-19 MED ORDER — ONDANSETRON HCL 4 MG/2ML IJ SOLN: 4.0000 mg | INTRAMUSCULAR | Status: DC | PRN

## 2022-07-19 MED ORDER — OXYTOCIN BOLUS FROM INFUSION
333.0000 mL | Freq: Once | INTRAVENOUS | Status: AC
  Administered 2022-07-19: 333 mL via INTRAVENOUS

## 2022-07-19 MED ORDER — WITCH HAZEL-GLYCERIN EX PADS: 1.0000 | MEDICATED_PAD | CUTANEOUS | Status: DC | PRN

## 2022-07-19 MED ORDER — COCONUT OIL OIL: 1.0000 | TOPICAL_OIL | Status: DC | PRN

## 2022-07-19 MED ORDER — OXYTOCIN-SODIUM CHLORIDE 30-0.9 UT/500ML-% IV SOLN
1.0000 m[IU]/min | INTRAVENOUS | Status: DC
  Administered 2022-07-19: 2 m[IU]/min via INTRAVENOUS

## 2022-07-19 MED ORDER — PRENATAL MULTIVITAMIN CH
1.0000 | ORAL_TABLET | Freq: Every day | ORAL | Status: DC
  Administered 2022-07-20: 1 via ORAL
  Filled 2022-07-19: qty 1

## 2022-07-19 MED ORDER — SIMETHICONE 80 MG PO CHEW: 80.0000 mg | CHEWABLE_TABLET | ORAL | Status: DC | PRN

## 2022-07-19 MED ORDER — IBUPROFEN 600 MG PO TABS
600.0000 mg | ORAL_TABLET | Freq: Four times a day (QID) | ORAL | Status: DC
  Administered 2022-07-20 (×3): 600 mg via ORAL
  Filled 2022-07-19 (×4): qty 1

## 2022-07-19 MED ORDER — ACETAMINOPHEN 325 MG PO TABS: 650.0000 mg | ORAL_TABLET | ORAL | Status: DC | PRN

## 2022-07-19 MED ORDER — OXYCODONE HCL 5 MG PO TABS: 10.0000 mg | ORAL_TABLET | ORAL | Status: DC | PRN

## 2022-07-19 MED ORDER — OXYCODONE-ACETAMINOPHEN 5-325 MG PO TABS: 1.0000 | ORAL_TABLET | ORAL | Status: DC | PRN

## 2022-07-19 MED ORDER — LACTATED RINGERS IV SOLN: 500.0000 mL | INTRAVENOUS | Status: DC | PRN

## 2022-07-19 MED ORDER — TRANEXAMIC ACID-NACL 1000-0.7 MG/100ML-% IV SOLN
1000.0000 mg | INTRAVENOUS | Status: AC
  Administered 2022-07-19: 1000 mg via INTRAVENOUS
  Filled 2022-07-19: qty 100

## 2022-07-19 MED ORDER — TERBUTALINE SULFATE 1 MG/ML IJ SOLN: 0.2500 mg | Freq: Once | INTRAMUSCULAR | Status: DC | PRN

## 2022-07-19 MED ORDER — ONDANSETRON HCL 4 MG/2ML IJ SOLN: 4.0000 mg | Freq: Four times a day (QID) | INTRAMUSCULAR | Status: DC | PRN

## 2022-07-19 MED ORDER — LACTATED RINGERS IV SOLN: 500.0000 mL | Freq: Once | INTRAVENOUS | Status: DC

## 2022-07-19 MED ORDER — TETANUS-DIPHTH-ACELL PERTUSSIS 5-2.5-18.5 LF-MCG/0.5 IM SUSY: 0.5000 mL | PREFILLED_SYRINGE | Freq: Once | INTRAMUSCULAR | Status: DC

## 2022-07-19 MED ORDER — DIBUCAINE (PERIANAL) 1 % EX OINT: 1.0000 | TOPICAL_OINTMENT | CUTANEOUS | Status: DC | PRN

## 2022-07-19 MED ORDER — LIDOCAINE HCL (PF) 1 % IJ SOLN: 30.0000 mL | INTRAMUSCULAR | Status: DC | PRN

## 2022-07-19 MED ORDER — OXYTOCIN-SODIUM CHLORIDE 30-0.9 UT/500ML-% IV SOLN
2.5000 [IU]/h | INTRAVENOUS | Status: DC
  Filled 2022-07-19: qty 500

## 2022-07-19 MED ORDER — PHENYLEPHRINE 80 MCG/ML (10ML) SYRINGE FOR IV PUSH (FOR BLOOD PRESSURE SUPPORT)
80.0000 ug | PREFILLED_SYRINGE | INTRAVENOUS | Status: DC | PRN
  Administered 2022-07-19: 80 ug via INTRAVENOUS
  Filled 2022-07-19: qty 10

## 2022-07-19 MED ORDER — DIPHENHYDRAMINE HCL 50 MG/ML IJ SOLN: 12.5000 mg | INTRAMUSCULAR | Status: DC | PRN

## 2022-07-19 MED ORDER — SENNOSIDES-DOCUSATE SODIUM 8.6-50 MG PO TABS
2.0000 | ORAL_TABLET | Freq: Every day | ORAL | Status: DC
  Administered 2022-07-20: 2 via ORAL
  Filled 2022-07-19: qty 2

## 2022-07-19 NOTE — Anesthesia Preprocedure Evaluation (Signed)
Anesthesia Evaluation  Patient identified by MRN, date of birth, ID band Patient awake    Reviewed: Allergy & Precautions, NPO status , Patient's Chart, lab work & pertinent test results  Airway Mallampati: II  TM Distance: >3 FB Neck ROM: Full    Dental no notable dental hx.    Pulmonary asthma , former smoker   Pulmonary exam normal breath sounds clear to auscultation       Cardiovascular hypertension (gHTN), Normal cardiovascular exam Rhythm:Regular Rate:Normal     Neuro/Psych negative neurological ROS  negative psych ROS   GI/Hepatic negative GI ROS, Neg liver ROS,,,  Endo/Other  negative endocrine ROS    Renal/GU negative Renal ROS  negative genitourinary   Musculoskeletal negative musculoskeletal ROS (+)    Abdominal   Peds  Hematology  (+) Blood dyscrasia (Hgb 9.9), anemia   Anesthesia Other Findings IOL for gHTN  Reproductive/Obstetrics (+) Pregnancy                             Anesthesia Physical Anesthesia Plan  ASA: 3  Anesthesia Plan: Epidural   Post-op Pain Management:    Induction:   PONV Risk Score and Plan: Treatment may vary due to age or medical condition  Airway Management Planned: Natural Airway  Additional Equipment:   Intra-op Plan:   Post-operative Plan:   Informed Consent: I have reviewed the patients History and Physical, chart, labs and discussed the procedure including the risks, benefits and alternatives for the proposed anesthesia with the patient or authorized representative who has indicated his/her understanding and acceptance.       Plan Discussed with: Anesthesiologist  Anesthesia Plan Comments: (Patient identified. Risks, benefits, options discussed with patient including but not limited to bleeding, infection, nerve damage, paralysis, failed block, incomplete pain control, headache, blood pressure changes, nausea, vomiting, reactions  to medication, itching, and post partum back pain. Confirmed with bedside nurse the patient's most recent platelet count. Confirmed with the patient that they are not taking any anticoagulation, have any bleeding history or any family history of bleeding disorders. Patient expressed understanding and wishes to proceed. All questions were answered. )       Anesthesia Quick Evaluation

## 2022-07-19 NOTE — Discharge Summary (Signed)
Postpartum Discharge Summary    Patient Name: Donne Anonanisha Alsteen DOB: 02/28/1991 MRN: [id number]  Date of admission: 07/19/2022 Delivery date:07/19/2022  Delivering provider: Myrtie HawkMERCADO-ORTIZ, JESSICA Q  Date of discharge: 07/20/2022  Admitting diagnosis: Gestational hypertension without significant proteinuria in third trimester [O13.3] Intrauterine pregnancy: 299w1d     Secondary diagnosis:  Principal Problem:   Vaginal delivery Active Problems:   Supervision of high-risk pregnancy   History of herpes genitalis   Gestational hypertension without significant proteinuria in third trimester  Additional problems: None    Discharge diagnosis: Term Pregnancy Delivered and Gestational Hypertension                                              Post partum procedures: N/A Augmentation: AROM and Pitocin Complications: None  Hospital course: Induction of Labor With Vaginal Delivery   32 y.o. yo [id number] at 449w1d was admitted to the hospital 07/19/2022 for induction of labor.  Indication for induction: Gestational hypertension.  Patient had an labor course complicated by none Membrane Rupture Time/Date: 1:38 PM ,07/19/2022   Delivery Method:Vaginal, Spontaneous  Episiotomy: None  Lacerations:  None  Details of delivery can be found in separate delivery note.  Patient had an uncomplicated postpartum course. Patient is discharged home 07/20/22.  Newborn Data: Birth date:07/19/2022  Birth time:4:21 PM  Gender:Female  Living status:Living  Apgars:8 ,9  Weight:3480 g   Magnesium Sulfate received: No BMZ received: No Rhophylac:N/A MMR:N/A T-DaP:Given prenatally Flu: No Transfusion:No  Physical exam  Vitals:   07/19/22 1701 07/19/22 1716 07/19/22 1731 07/19/22 1746  BP: (!) 118/44 123/72 129/79 121/71  Pulse: (!) 103 (!) 107 96 100  Resp:      Temp:      TempSrc:      SpO2:      Weight:      Height:       General: alert, cooperative, and no distress Lochia: appropriate Uterine Fundus:  firm Incision: N/A DVT Evaluation: No evidence of DVT seen on physical exam. Labs: Lab Results  Component Value Date   WBC 8.8 07/19/2022   HGB 9.9 (L) 07/19/2022   HCT 31.0 (L) 07/19/2022   MCV 73.5 (L) 07/19/2022   PLT 142 (L) 07/19/2022      Latest Ref Rng & Units 07/19/2022    8:17 AM  CMP  Glucose 70 - 99 mg/dL 85   BUN 6 - 20 mg/dL 10   Creatinine 6.210.44 - 1.00 mg/dL 3.080.56   Sodium 657135 - 846145 mmol/L 134   Potassium 3.5 - 5.1 mmol/L 3.4   Chloride 98 - 111 mmol/L 105   CO2 22 - 32 mmol/L 19   Calcium 8.9 - 10.3 mg/dL 8.2   Total Protein 6.5 - 8.1 g/dL 5.8   Total Bilirubin 0.3 - 1.2 mg/dL 0.6   Alkaline Phos 38 - 126 U/L 76   AST 15 - 41 U/L 20   ALT 0 - 44 U/L 13    Edinburgh Score:     No data to display           After visit meds:  Allergies as of 07/20/2022   No Known Allergies      Medication List     STOP taking these medications    cetirizine 10 MG tablet Commonly known as: ZYRTEC   ferrous fumarate 325 (106 Fe) MG Tabs tablet Commonly known as:  HEMOCYTE - 106 mg FE       TAKE these medications    acetaminophen 325 MG tablet Commonly known as: Tylenol Take 2 tablets (650 mg total) by mouth every 4 (four) hours as needed (for pain scale < 4).   albuterol 108 (90 Base) MCG/ACT inhaler Commonly known as: VENTOLIN HFA Inhale 2 puffs into the lungs every 2 (two) hours as needed for wheezing or shortness of breath (cough).   Blood Pressure Monitoring Devi 1 each by Does not apply route once a week.   erythromycin ophthalmic ointment Place a 1/2 inch ribbon of ointment into the lower eyelid 4x daily for 7 days   fluticasone 44 MCG/ACT inhaler Commonly known as: FLOVENT HFA Inhale 2 puffs into the lungs 2 (two) times daily.   ibuprofen 600 MG tablet Commonly known as: ADVIL Take 1 tablet (600 mg total) by mouth every 6 (six) hours.   iron polysaccharides 150 MG capsule Commonly known as: NIFEREX Take 1 capsule (150 mg total) by mouth  every other day.   multivitamin-prenatal 27-0.8 MG Tabs tablet Take 1 tablet by mouth daily at 12 noon.   predniSONE 20 MG tablet Commonly known as: DELTASONE Take 2 tablets (40 mg total) by mouth daily.   senna-docusate 8.6-50 MG tablet Commonly known as: Senokot-S Take 2 tablets by mouth daily as needed for mild constipation.   valACYclovir 500 MG tablet Commonly known as: Valtrex Take 1 tablet (500 mg total) by mouth 2 (two) times daily.         Discharge home in stable condition Infant Feeding: Bottle and Breast Infant Disposition:home with mother Discharge instruction: per After Visit Summary and Postpartum booklet. Activity: Advance as tolerated. Pelvic rest for 6 weeks.  Diet: routine diet Future Appointments: Future Appointments  Date Time Provider Department Center  07/23/2022  9:55 AM Reva Bores, MD [facility]  07/30/2022 10:15 AM Carlynn Herald, CNM [facility]  08/06/2022  8:55 AM Marny Lowenstein, PA-C [facility]   Follow up Visit: Message sent to [facility] on 4/8  Please schedule this patient for a In person postpartum visit in 6 weeks with the following provider: Any provider. Additional Postpartum F/U:BP check 1 week  High risk pregnancy complicated by: HTN Delivery mode:  Vaginal, Spontaneous  Anticipated Birth Control:  Depo Provera  Patient requests discharge home as close to 24 hours of life as possible (after 1621 today). CNM confirmed with patient that there are evaluations performed on baby as 24 hours that will impact staff's ability to discharge at exactly 24 hours. Patient verbalizes understanding  Clayton Bibles, MSA, MSN, CNM Certified Nurse Midwife, Biochemist, clinical for [employer], [facility]

## 2022-07-19 NOTE — H&P (Signed)
OBSTETRIC ADMISSION HISTORY AND PHYSICAL  Yvonne Jenkins is a 32 y.o. female [phone] with IUP at [redacted]w[redacted]d by LMP presenting for IOL for gHTN. She reports +FMs, No LOF, no VB, no blurry vision, headaches or peripheral edema, and RUQ pain.  She plans on breat and bottle feeding. She request POP for birth control. She received her prenatal care at [facility]   Dating: By LMP --->  Estimated Date of Delivery: 08/01/22  Sono:    @[redacted]w[redacted]d , CWD, normal anatomy, cephalic presentation, 3222g, 74% EFW   Prenatal History/Complications: hx HSV on valtrex, gHTN, hx polyhydramnios- resolved  Nursing Staff Provider  Office Location [facility] Dating  08/01/2022, by Last Menstrual Period  [facility] Model Arly.Keller ] Traditional [ ]  Centering [ ]  Mom-Baby Dyad Anatomy US  WNL but incomplete f/u 5 weeks  Language  English    Flu Vaccine  declined Genetic/Carrier Screen  NIPS:   LR Female AFP:  NEG  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   TDaP Vaccine   Given 05/18/22 Hgb A1C or  GTT Early  Third trimester   COVID Vaccine    LAB RESULTS   Rhogam  A/Positive/-- (11/13 1610)  Blood Type A/Positive/-- (11/13 1610)   Baby Feeding Plan Both Antibody Negative (11/13 1610)  Contraception POPs Rubella 3.66 (11/13 1610)  Circumcision Yes RPR Non Reactive (11/13 1610)   Pediatrician  List given HBsAg Negative (11/13 1610)   Support Person Tinnie Gens HCVAb Non Reactive (11/13 1610)   Prenatal Classes  HIV Non Reactive (11/13 1610)     BTL Consent NA GBS   (For PCN allergy, check sensitivities)   VBAC Consent NA Pap Diagnosis  Date Value Ref Range Status  02/22/2022   Final   - Negative for Intraepithelial Lesions or Malignancy (NILM)  02/22/2022 - Benign reactive/reparative changes  Final         DME Rx Arly.Keller ] BP cuff [ ]  Weight Scale Waterbirth  [ ]  Class [ ]  Consent [ ]  CNM visit  PHQ9 & GAD7 [ x ] new OB [  ] 28 weeks  [  ] 36 weeks Induction  [ ]  Orders Entered [ ] Foley Y/N   Past Medical History: Past Medical History:   Diagnosis Date   Asthma    last used inhaler 65mo ago   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 06/24/2021   Retained products of conception after miscarriage 06/26/2021   Subchorionic hemorrhage in first trimester 06/24/2021    Past Surgical History: Past Surgical History:  Procedure Laterality Date   DILATION AND EVACUATION N/A 06/26/2021   Procedure: DILATATION AND EVACUATION;  Surgeon: Milas Hock, MD;  Location: [facility] OR;  Service: Gynecology;  Laterality: N/A;    Obstetrical History: OB History     Gravida  7   Para  5   Term  5   Preterm      AB  1   Living  5      SAB  1   IAB      Ectopic      Multiple      Live Births  5           Social History Social History   Socioeconomic History   Marital status: Single    Spouse name: Not on file   Number of children: Not on file   Years of education: Not on file   Highest education level: Not on file  Occupational History   Not on file  Tobacco Use  Smoking status: Former    Packs/day: .25    Types: Cigarettes    Passive exposure: Current   Smokeless tobacco: Never  Vaping Use   Vaping Use: Never used  Substance and Sexual Activity   Alcohol use: No   Drug use: No   Sexual activity: Yes    Birth control/protection: Pill  Other Topics Concern   Not on file  Social History Narrative   Not on file   Social Determinants of Health   Financial Resource Strain: Not on file  Food Insecurity: Not on file  Transportation Needs: Not on file  Physical Activity: Not on file  Stress: Not on file  Social Connections: Not on file    Family History: Family History  Problem Relation Age of Onset   Diabetes Mother    Hypertension Neg Hx     Allergies: No Known Allergies  Medications Prior to Admission  Medication Sig Dispense Refill Last Dose   albuterol (VENTOLIN HFA) 108 (90 Base) MCG/ACT inhaler Inhale 2 puffs into the lungs every 2 (two) hours as needed for wheezing  or shortness of breath (cough). (Patient not taking: Reported on 07/16/2022) 1 each 0    Blood Pressure Monitoring DEVI 1 each by Does not apply route once a week. 1 each 0    cetirizine (ZYRTEC) 10 MG tablet Take 1 tablet (10 mg total) by mouth daily. 30 tablet 3    erythromycin ophthalmic ointment Place a 1/2 inch ribbon of ointment into the lower eyelid 4x daily for 7 days (Patient not taking: Reported on 07/16/2022) 3.5 g 0    ferrous fumarate (HEMOCYTE - 106 MG FE) 325 (106 Fe) MG TABS tablet Take 1 tablet (106 mg of iron total) by mouth every other day. (Patient not taking: Reported on 06/01/2022) 30 tablet 5    fluticasone (FLOVENT HFA) 44 MCG/ACT inhaler Inhale 2 puffs into the lungs 2 (two) times daily. (Patient not taking: Reported on 07/16/2022) 31.8 g 3    predniSONE (DELTASONE) 20 MG tablet Take 2 tablets (40 mg total) by mouth daily. (Patient not taking: Reported on 05/13/2022) 10 tablet 0    Prenatal Vit-Fe Fumarate-FA (MULTIVITAMIN-PRENATAL) 27-0.8 MG TABS tablet Take 1 tablet by mouth daily at 12 noon.      valACYclovir (VALTREX) 500 MG tablet Take 1 tablet (500 mg total) by mouth 2 (two) times daily. 60 tablet 1      Review of Systems   All systems reviewed and negative except as stated in HPI  Blood pressure 126/81, pulse (!) 109, temperature 97.8 F (36.6 C), temperature source Oral, resp. rate 18, height 5' 7.5" (1.715 m), weight 97.3 kg, last menstrual period 10/25/2021. General appearance: alert, cooperative, and appears stated age Lungs: clear to auscultation bilaterally Heart: regular rate and rhythm Abdomen: soft, non-tender; bowel sounds normal Pelvic: no lesions Extremities: Homans sign is negative, no sign of DVT Presentation: cephalic Fetal monitoringBaseline: 135 bpm, Variability: Good {> 6 bpm), Accelerations: Reactive, and Decelerations: Absent Uterine activity: occasional Dilation: 3.5 Effacement (%): 50 Station: -2 Exam by:: J.Cox, RN   Prenatal labs: ABO,  Rh: --/--/PENDING (04/08 0815) Antibody: PENDING (04/08 0815) Rubella: 3.66 (11/13 1610) RPR: Non Reactive (02/06 0917)  HBsAg: Negative (11/13 1610)  HIV: Non Reactive (02/06 0917)  GBS: Negative/-- (03/26 1651)   Prenatal Transfer Tool  Maternal Diabetes: No Genetic Screening: Normal Maternal Ultrasounds/Referrals: Normal Fetal Ultrasounds or other Referrals:  Referred to Materal Fetal Medicine  Maternal Substance Abuse:  No Significant Maternal Medications:  None Significant Maternal Lab Results:  Group B Strep negative Number of Prenatal Visits:greater than 3 verified prenatal visits Other Comments:  None  Results for orders placed or performed during the hospital encounter of 07/19/22 (from the past 24 hour(s))  Type and screen   Collection Time: 07/19/22  8:15 AM  Result Value Ref Range   ABO/RH(D) PENDING    Antibody Screen PENDING    Sample Expiration      07/22/2022,2359 Performed at [facility], [address] [place], [place] [address]   CBC   Collection Time: 07/19/22  8:17 AM  Result Value Ref Range   WBC 8.8 4.0 - 10.5 K/uL   RBC 4.22 3.87 - 5.11 MIL/uL   Hemoglobin 9.9 (L) 12.0 - 15.0 g/dL   HCT 60.431.0 (L) 54.036.0 - 98.146.0 %   MCV 73.5 (L) 80.0 - 100.0 fL   MCH 23.5 (L) 26.0 - 34.0 pg   MCHC 31.9 30.0 - 36.0 g/dL   RDW 19.117.1 (H) 47.811.5 - 29.515.5 %   Platelets 142 (L) 150 - 400 K/uL   nRBC 0.0 0.0 - 0.2 %  Comprehensive metabolic panel   Collection Time: 07/19/22  8:17 AM  Result Value Ref Range   Sodium 134 (L) 135 - 145 mmol/L   Potassium 3.4 (L) 3.5 - 5.1 mmol/L   Chloride 105 98 - 111 mmol/L   CO2 19 (L) 22 - 32 mmol/L   Glucose, Bld 85 70 - 99 mg/dL   BUN 10 6 - 20 mg/dL   Creatinine, Ser 6.210.56 0.44 - 1.00 mg/dL   Calcium 8.2 (L) 8.9 - 10.3 mg/dL   Total Protein 5.8 (L) 6.5 - 8.1 g/dL   Albumin 2.6 (L) 3.5 - 5.0 g/dL   AST 20 15 - 41 U/L   ALT 13 0 - 44 U/L   Alkaline Phosphatase 76 38 - 126 U/L   Total Bilirubin 0.6 0.3 - 1.2 mg/dL   GFR,  Estimated >30>60 >86>60 mL/min   Anion gap 10 5 - 15    Patient Active Problem List   Diagnosis Date Noted   Gestational hypertension without significant proteinuria in third trimester 07/19/2022   Gestational hypertension, third trimester 07/16/2022   History of herpes genitalis 06/01/2022   Fundal height high for dates 06/01/2022   Cervical polyp 02/22/2022   Supervision of high-risk pregnancy 12/29/2021   Genital herpes simplex 10/20/2021    Assessment/Plan:  Donne Anonanisha Mcgivern is a 32 y.o. G7P5015 at 3685w1d here for IOL for gHTN  #Labor: start pitocin 2x2, plan for AROM after epidural #Pain: Per pt request #FWB: CAT 1 #ID:  GBS neg, HSV on valtrex #MOF: Both breast and bottle #MOC: POP vs interval BTL

## 2022-07-19 NOTE — Anesthesia Procedure Notes (Signed)
Epidural Patient location during procedure: OB Start time: 07/19/2022 9:30 AM End time: 07/19/2022 9:40 AM  Staffing Anesthesiologist: Elmer Picker, MD Performed: anesthesiologist   Preanesthetic Checklist Completed: patient identified, IV checked, risks and benefits discussed, monitors and equipment checked, pre-op evaluation and timeout performed  Epidural Patient position: sitting Prep: DuraPrep and site prepped and draped Patient monitoring: continuous pulse ox, blood pressure, heart rate and cardiac monitor Approach: midline Location: L3-L4 Injection technique: LOR air  Needle:  Needle type: Tuohy  Needle gauge: 17 G Needle length: 9 cm Needle insertion depth: 5 cm Catheter type: closed end flexible Catheter size: 19 Gauge Catheter at skin depth: 10 cm Test dose: negative  Assessment Sensory level: T8 Events: blood not aspirated, no cerebrospinal fluid, injection not painful, no injection resistance, no paresthesia and negative IV test  Additional Notes Patient identified. Risks/Benefits/Options discussed with patient including but not limited to bleeding, infection, nerve damage, paralysis, failed block, incomplete pain control, headache, blood pressure changes, nausea, vomiting, reactions to medication both or allergic, itching and postpartum back pain. Confirmed with bedside nurse the patient's most recent platelet count. Confirmed with patient that they are not currently taking any anticoagulation, have any bleeding history or any family history of bleeding disorders. Patient expressed understanding and wished to proceed. All questions were answered. Sterile technique was used throughout the entire procedure. Please see nursing notes for vital signs. Test dose was given through epidural catheter and negative prior to continuing to dose epidural or start infusion. Warning signs of high block given to the patient including shortness of breath, tingling/numbness in hands,  complete motor block, or any concerning symptoms with instructions to call for help. Patient was given instructions on fall risk and not to get out of bed. All questions and concerns addressed with instructions to call with any issues or inadequate analgesia.  Reason for block:procedure for pain

## 2022-07-19 NOTE — Progress Notes (Signed)
Labor Progress Note  Santi Benitz is a 32 y.o. [id number] at [redacted]w[redacted]d presented for IOL gHTN  S: Pt feeling more contraction pain  O:  BP 123/71   Pulse 83   Temp 97.8 F (36.6 C) (Oral)   Resp 18   Ht 5' 7.5" (1.715 m)   Wt 97.3 kg   LMP 10/25/2021   SpO2 100%   BMI 33.10 kg/m  EFM: 125 bpm/Moderate variability/ 15x15 accels/ None decels  CVE: Dilation: 5 Effacement (%): 50 Station: -2 Presentation: Vertex Exam by:: Mercado-Ortiz   A&P: 32 y.o. [id number] [redacted]w[redacted]d  here for IOL as above  #Labor: Progressing well. AROM clear, continue pitocin #Pain: Family/Friend support and Epidural #FWB: CAT 1 #GBS negative #gHTN: BP well controlled. PreE labs nml  Myrtie Hawk, DO FMOB Fellow, Faculty practice [facility], Center for [facility] 07/19/22  1:55 PM

## 2022-07-20 ENCOUNTER — Other Ambulatory Visit (HOSPITAL_COMMUNITY): Payer: Self-pay

## 2022-07-20 LAB — CBC
HCT: 30.1 % — ABNORMAL LOW (ref 36.0–46.0)
Hemoglobin: 9.5 g/dL — ABNORMAL LOW (ref 12.0–15.0)
MCH: 23.6 pg — ABNORMAL LOW (ref 26.0–34.0)
MCHC: 31.6 g/dL (ref 30.0–36.0)
MCV: 74.9 fL — ABNORMAL LOW (ref 80.0–100.0)
Platelets: 135 K/uL — ABNORMAL LOW (ref 150–400)
RBC: 4.02 MIL/uL (ref 3.87–5.11)
RDW: 17.3 % — ABNORMAL HIGH (ref 11.5–15.5)
WBC: 11.7 K/uL — ABNORMAL HIGH (ref 4.0–10.5)
nRBC: 0 % (ref 0.0–0.2)

## 2022-07-20 MED ORDER — IRON POLYSACCH CMPLX-B12-FA 150-0.025-1 MG PO CAPS
ORAL_CAPSULE | ORAL | 0 refills | Status: AC
  Filled 2022-07-20: qty 15, 30d supply, fill #0

## 2022-07-20 MED ORDER — SENNOSIDES-DOCUSATE SODIUM 8.6-50 MG PO TABS
2.0000 | ORAL_TABLET | Freq: Every day | ORAL | 0 refills | Status: AC | PRN
  Filled 2022-07-20: qty 60, 30d supply, fill #0

## 2022-07-20 MED ORDER — IBUPROFEN 600 MG PO TABS
600.0000 mg | ORAL_TABLET | Freq: Four times a day (QID) | ORAL | 0 refills | Status: DC
  Filled 2022-07-20: qty 30, 8d supply, fill #0

## 2022-07-20 MED ORDER — ACETAMINOPHEN 325 MG PO TABS
650.0000 mg | ORAL_TABLET | ORAL | 0 refills | Status: AC | PRN
  Filled 2022-07-20: qty 100, 9d supply, fill #0

## 2022-07-20 MED ORDER — MEDROXYPROGESTERONE ACETATE 150 MG/ML IM SUSP
150.0000 mg | Freq: Once | INTRAMUSCULAR | Status: AC
  Administered 2022-07-20: 150 mg via INTRAMUSCULAR
  Filled 2022-07-20: qty 1

## 2022-07-20 NOTE — Anesthesia Postprocedure Evaluation (Signed)
Anesthesia Post Note  Patient: Yvonne Jenkins  Procedure(s) Performed: AN AD HOC LABOR EPIDURAL     Patient location during evaluation: Mother Baby Anesthesia Type: Epidural Level of consciousness: awake Pain management: satisfactory to patient Vital Signs Assessment: post-procedure vital signs reviewed and stable Respiratory status: spontaneous breathing Cardiovascular status: stable Anesthetic complications: no  No notable events documented.  Last Vitals:  Vitals:   07/19/22 2354 07/20/22 0609  BP: (!) 102/91 105/61  Pulse: 83 79  Resp: 18 18  Temp: 36.7 C 36.6 C  SpO2:      Last Pain:  Vitals:   07/20/22 0609  TempSrc: Oral  PainSc: 6    Pain Goal:                   Cephus Shelling

## 2022-07-20 NOTE — Plan of Care (Signed)
Problem: Education: Goal: Knowledge of Childbirth will improve 07/20/2022 1103 by Donne Hazel, LPN Outcome: Adequate for Discharge 07/20/2022 1059 by Donne Hazel, LPN Outcome: Progressing Goal: Ability to make informed decisions regarding treatment and plan of care will improve 07/20/2022 1103 by Donne Hazel, LPN Outcome: Adequate for Discharge 07/20/2022 1059 by Donne Hazel, LPN Outcome: Progressing Goal: Ability to state and carry out methods to decrease the pain will improve 07/20/2022 1103 by Donne Hazel, LPN Outcome: Adequate for Discharge 07/20/2022 1059 by Donne Hazel, LPN Outcome: Progressing Goal: Individualized Educational Video(s) 07/20/2022 1103 by Donne Hazel, LPN Outcome: Adequate for Discharge 07/20/2022 1059 by Donne Hazel, LPN Outcome: Progressing   Problem: Coping: Goal: Ability to verbalize concerns and feelings about labor and delivery will improve 07/20/2022 1103 by Donne Hazel, LPN Outcome: Adequate for Discharge 07/20/2022 1059 by Donne Hazel, LPN Outcome: Progressing   Problem: Life Cycle: Goal: Ability to make normal progression through stages of labor will improve 07/20/2022 1103 by Donne Hazel, LPN Outcome: Adequate for Discharge 07/20/2022 1059 by Donne Hazel, LPN Outcome: Progressing Goal: Ability to effectively push during vaginal delivery will improve 07/20/2022 1103 by Donne Hazel, LPN Outcome: Adequate for Discharge 07/20/2022 1059 by Donne Hazel, LPN Outcome: Progressing   Problem: Role Relationship: Goal: Will demonstrate positive interactions with the child 07/20/2022 1103 by Donne Hazel, LPN Outcome: Adequate for Discharge 07/20/2022 1059 by Donne Hazel, LPN Outcome: Progressing   Problem: Safety: Goal: Risk of complications during labor and delivery will decrease 07/20/2022 1103 by Donne Hazel, LPN Outcome: Adequate for Discharge 07/20/2022 1059 by Donne Hazel, LPN Outcome: Progressing   Problem: Pain  Management: Goal: Relief or control of pain from uterine contractions will improve 07/20/2022 1103 by Donne Hazel, LPN Outcome: Adequate for Discharge 07/20/2022 1059 by Donne Hazel, LPN Outcome: Progressing   Problem: Education: Goal: Knowledge of General Education information will improve Description: Including pain rating scale, medication(s)/side effects and non-pharmacologic comfort measures 07/20/2022 1103 by Donne Hazel, LPN Outcome: Adequate for Discharge 07/20/2022 1059 by Donne Hazel, LPN Outcome: Progressing   Problem: Health Behavior/Discharge Planning: Goal: Ability to manage health-related needs will improve 07/20/2022 1103 by Donne Hazel, LPN Outcome: Adequate for Discharge 07/20/2022 1059 by Donne Hazel, LPN Outcome: Progressing   Problem: Clinical Measurements: Goal: Ability to maintain clinical measurements within normal limits will improve 07/20/2022 1103 by Donne Hazel, LPN Outcome: Adequate for Discharge 07/20/2022 1059 by Donne Hazel, LPN Outcome: Progressing Goal: Will remain free from infection 07/20/2022 1103 by Donne Hazel, LPN Outcome: Adequate for Discharge 07/20/2022 1059 by Donne Hazel, LPN Outcome: Progressing Goal: Diagnostic test results will improve 07/20/2022 1103 by Donne Hazel, LPN Outcome: Adequate for Discharge 07/20/2022 1059 by Donne Hazel, LPN Outcome: Progressing Goal: Respiratory complications will improve 07/20/2022 1103 by Donne Hazel, LPN Outcome: Adequate for Discharge 07/20/2022 1059 by Donne Hazel, LPN Outcome: Progressing Goal: Cardiovascular complication will be avoided 07/20/2022 1103 by Donne Hazel, LPN Outcome: Adequate for Discharge 07/20/2022 1059 by Donne Hazel, LPN Outcome: Progressing   Problem: Activity: Goal: Risk for activity intolerance will decrease 07/20/2022 1103 by Donne Hazel, LPN Outcome: Adequate for Discharge 07/20/2022 1059 by Donne Hazel, LPN Outcome: Progressing    Problem: Nutrition: Goal: Adequate nutrition will be maintained 07/20/2022 1103 by Donne Hazel, LPN Outcome: Adequate for Discharge 07/20/2022 1059 by  Adam Phenix A, LPN Outcome: Progressing   Problem: Coping: Goal: Level of anxiety will decrease 07/20/2022 1103 by Donne Hazel, LPN Outcome: Adequate for Discharge 07/20/2022 1059 by Donne Hazel, LPN Outcome: Progressing   Problem: Elimination: Goal: Will not experience complications related to bowel motility 07/20/2022 1103 by Donne Hazel, LPN Outcome: Adequate for Discharge 07/20/2022 1059 by Donne Hazel, LPN Outcome: Progressing Goal: Will not experience complications related to urinary retention 07/20/2022 1103 by Donne Hazel, LPN Outcome: Adequate for Discharge 07/20/2022 1059 by Donne Hazel, LPN Outcome: Progressing   Problem: Pain Managment: Goal: General experience of comfort will improve 07/20/2022 1103 by Donne Hazel, LPN Outcome: Adequate for Discharge 07/20/2022 1059 by Donne Hazel, LPN Outcome: Progressing   Problem: Safety: Goal: Ability to remain free from injury will improve 07/20/2022 1103 by Donne Hazel, LPN Outcome: Adequate for Discharge 07/20/2022 1059 by Donne Hazel, LPN Outcome: Progressing   Problem: Skin Integrity: Goal: Risk for impaired skin integrity will decrease 07/20/2022 1103 by Donne Hazel, LPN Outcome: Adequate for Discharge 07/20/2022 1059 by Donne Hazel, LPN Outcome: Progressing   Problem: Education: Goal: Knowledge of condition will improve 07/20/2022 1103 by Donne Hazel, LPN Outcome: Adequate for Discharge 07/20/2022 1059 by Donne Hazel, LPN Outcome: Progressing Goal: Individualized Educational Video(s) 07/20/2022 1103 by Donne Hazel, LPN Outcome: Adequate for Discharge 07/20/2022 1059 by Donne Hazel, LPN Outcome: Progressing Goal: Individualized Newborn Educational Video(s) 07/20/2022 1103 by Donne Hazel, LPN Outcome: Adequate for Discharge 07/20/2022  1059 by Donne Hazel, LPN Outcome: Progressing   Problem: Activity: Goal: Will verbalize the importance of balancing activity with adequate rest periods 07/20/2022 1103 by Donne Hazel, LPN Outcome: Adequate for Discharge 07/20/2022 1059 by Adam Phenix A, LPN Outcome: Progressing Goal: Ability to tolerate increased activity will improve 07/20/2022 1103 by Donne Hazel, LPN Outcome: Adequate for Discharge 07/20/2022 1059 by Donne Hazel, LPN Outcome: Progressing   Problem: Coping: Goal: Ability to identify and utilize available resources and services will improve 07/20/2022 1103 by Donne Hazel, LPN Outcome: Adequate for Discharge 07/20/2022 1059 by Donne Hazel, LPN Outcome: Progressing   Problem: Life Cycle: Goal: Chance of risk for complications during the postpartum period will decrease 07/20/2022 1103 by Donne Hazel, LPN Outcome: Adequate for Discharge 07/20/2022 1059 by Donne Hazel, LPN Outcome: Progressing   Problem: Role Relationship: Goal: Ability to demonstrate positive interaction with newborn will improve 07/20/2022 1103 by Donne Hazel, LPN Outcome: Adequate for Discharge 07/20/2022 1059 by Donne Hazel, LPN Outcome: Progressing   Problem: Skin Integrity: Goal: Demonstration of wound healing without infection will improve 07/20/2022 1103 by Donne Hazel, LPN Outcome: Adequate for Discharge 07/20/2022 1059 by Donne Hazel, LPN Outcome: Progressing   Problem: Education: Goal: Knowledge of condition will improve 07/20/2022 1103 by Donne Hazel, LPN Outcome: Adequate for Discharge 07/20/2022 1059 by Donne Hazel, LPN Outcome: Progressing Goal: Individualized Educational Video(s) 07/20/2022 1103 by Donne Hazel, LPN Outcome: Adequate for Discharge 07/20/2022 1059 by Donne Hazel, LPN Outcome: Progressing Goal: Individualized Newborn Educational Video(s) 07/20/2022 1103 by Donne Hazel, LPN Outcome: Adequate for Discharge 07/20/2022 1059 by Donne Hazel, LPN Outcome: Progressing   Problem: Activity: Goal: Will verbalize the importance of balancing activity with adequate rest periods 07/20/2022 1103 by Donne Hazel, LPN Outcome: Adequate for Discharge 07/20/2022 1059 by Donne Hazel, LPN Outcome: Progressing Goal: Ability  to tolerate increased activity will improve 07/20/2022 1103 by Donne HazelHuff, Philana Younis A, LPN Outcome: Adequate for Discharge 07/20/2022 1059 by Donne HazelHuff, Kimala Horne A, LPN Outcome: Progressing   Problem: Coping: Goal: Ability to identify and utilize available resources and services will improve 07/20/2022 1103 by Donne HazelHuff, Magaline Steinberg A, LPN Outcome: Adequate for Discharge 07/20/2022 1059 by Donne HazelHuff, Nafisa Olds A, LPN Outcome: Progressing   Problem: Life Cycle: Goal: Chance of risk for complications during the postpartum period will decrease 07/20/2022 1103 by Donne HazelHuff, Maxamillian Tienda A, LPN Outcome: Adequate for Discharge 07/20/2022 1059 by Donne HazelHuff, Charmika Macdonnell A, LPN Outcome: Progressing   Problem: Role Relationship: Goal: Ability to demonstrate positive interaction with newborn will improve 07/20/2022 1103 by Donne HazelHuff, Marcina Kinnison A, LPN Outcome: Adequate for Discharge 07/20/2022 1059 by Donne HazelHuff, Tynisa Vohs A, LPN Outcome: Progressing   Problem: Skin Integrity: Goal: Demonstration of wound healing without infection will improve 07/20/2022 1103 by Donne HazelHuff, Valorie Mcgrory A, LPN Outcome: Adequate for Discharge 07/20/2022 1059 by Donne HazelHuff, Tevis Conger A, LPN Outcome: Progressing

## 2022-07-20 NOTE — Plan of Care (Signed)
  Problem: Education: Goal: Knowledge of Childbirth will improve Outcome: Progressing Goal: Ability to make informed decisions regarding treatment and plan of care will improve Outcome: Progressing Goal: Ability to state and carry out methods to decrease the pain will improve Outcome: Progressing Goal: Individualized Educational Video(s) Outcome: Progressing   Problem: Coping: Goal: Ability to verbalize concerns and feelings about labor and delivery will improve Outcome: Progressing   Problem: Life Cycle: Goal: Ability to make normal progression through stages of labor will improve Outcome: Progressing Goal: Ability to effectively push during vaginal delivery will improve Outcome: Progressing   Problem: Role Relationship: Goal: Will demonstrate positive interactions with the child Outcome: Progressing   Problem: Safety: Goal: Risk of complications during labor and delivery will decrease Outcome: Progressing   Problem: Pain Management: Goal: Relief or control of pain from uterine contractions will improve Outcome: Progressing   Problem: Education: Goal: Knowledge of General Education information will improve Description: Including pain rating scale, medication(s)/side effects and non-pharmacologic comfort measures Outcome: Progressing   Problem: Health Behavior/Discharge Planning: Goal: Ability to manage health-related needs will improve Outcome: Progressing   Problem: Clinical Measurements: Goal: Ability to maintain clinical measurements within normal limits will improve Outcome: Progressing Goal: Will remain free from infection Outcome: Progressing Goal: Diagnostic test results will improve Outcome: Progressing Goal: Respiratory complications will improve Outcome: Progressing Goal: Cardiovascular complication will be avoided Outcome: Progressing   Problem: Activity: Goal: Risk for activity intolerance will decrease Outcome: Progressing   Problem:  Nutrition: Goal: Adequate nutrition will be maintained Outcome: Progressing   Problem: Coping: Goal: Level of anxiety will decrease Outcome: Progressing   Problem: Elimination: Goal: Will not experience complications related to bowel motility Outcome: Progressing Goal: Will not experience complications related to urinary retention Outcome: Progressing   Problem: Pain Managment: Goal: General experience of comfort will improve Outcome: Progressing   Problem: Safety: Goal: Ability to remain free from injury will improve Outcome: Progressing   Problem: Skin Integrity: Goal: Risk for impaired skin integrity will decrease Outcome: Progressing   Problem: Education: Goal: Knowledge of condition will improve Outcome: Progressing Goal: Individualized Educational Video(s) Outcome: Progressing Goal: Individualized Newborn Educational Video(s) Outcome: Progressing   Problem: Activity: Goal: Will verbalize the importance of balancing activity with adequate rest periods Outcome: Progressing Goal: Ability to tolerate increased activity will improve Outcome: Progressing   Problem: Coping: Goal: Ability to identify and utilize available resources and services will improve Outcome: Progressing   Problem: Life Cycle: Goal: Chance of risk for complications during the postpartum period will decrease Outcome: Progressing   Problem: Role Relationship: Goal: Ability to demonstrate positive interaction with newborn will improve Outcome: Progressing   Problem: Skin Integrity: Goal: Demonstration of wound healing without infection will improve Outcome: Progressing   Problem: Education: Goal: Knowledge of condition will improve Outcome: Progressing Goal: Individualized Educational Video(s) Outcome: Progressing Goal: Individualized Newborn Educational Video(s) Outcome: Progressing   Problem: Activity: Goal: Will verbalize the importance of balancing activity with adequate rest  periods Outcome: Progressing Goal: Ability to tolerate increased activity will improve Outcome: Progressing   Problem: Coping: Goal: Ability to identify and utilize available resources and services will improve Outcome: Progressing   Problem: Life Cycle: Goal: Chance of risk for complications during the postpartum period will decrease Outcome: Progressing   Problem: Role Relationship: Goal: Ability to demonstrate positive interaction with newborn will improve Outcome: Progressing   Problem: Skin Integrity: Goal: Demonstration of wound healing without infection will improve Outcome: Progressing   

## 2022-07-20 NOTE — Discharge Instructions (Addendum)
WHAT TO LOOK OUT FOR: Fever of 100.4 or above Mastitis: feels like flu and breasts hurt Infection: increased pain, swelling or redness Blood clots golf ball size or larger Postpartum depression   Congratulations on your newest addition!Vaginal Delivery Care After Refer to this sheet in the next few weeks. These discharge instructions provide you with information on caring for yourself after delivery. Your caregiver may also give you specific instructions. Your treatment has been planned according to the most current medical practices available, but problems sometimes occur. Call your caregiver if you have any problems or questions after you go home. HOME CARE INSTRUCTIONS Take over-the-counter or prescription medicines only as directed by your caregiver or pharmacist. Do not drink alcohol, especially if you are breastfeeding or taking medicine to relieve pain. Do not chew or smoke tobacco. Do not use illegal drugs. Continue to use good perineal care. Good perineal care includes: Wiping your perineum from front to back. Keeping your perineum clean. Do not use tampons or douche until your caregiver says it is okay. Shower, wash your hair, and take tub baths as directed by your caregiver. Wear a well-fitting bra that provides breast support. Eat healthy foods. Drink enough fluids to keep your urine clear or pale yellow. Eat high-fiber foods such as whole grain cereals and breads, brown rice, beans, and fresh fruits and vegetables every day. These foods may help prevent or relieve constipation. Follow your cargiver's recommendations regarding resumption of activities such as climbing stairs, driving, lifting, exercising, or traveling. Talk to your caregiver about resuming sexual activities. Resumption of sexual activities is dependent upon your risk of infection, your rate of healing, and your comfort and desire to resume sexual activity. Try to have someone help you with your household  activities and your newborn for at least a few days after you leave the hospital. Rest as much as possible. Try to rest or take a nap when your newborn is sleeping. Increase your activities gradually. Keep all of your scheduled postpartum appointments. It is very important to keep your scheduled follow-up appointments. At these appointments, your caregiver will be checking to make sure that you are healing physically and emotionally. SEEK MEDICAL CARE IF:  You are passing large clots from your vagina. Save any clots to show your caregiver. You have a foul smelling discharge from your vagina. You have trouble urinating. You are urinating frequently. You have pain when you urinate. You have a change in your bowel movements. You have increasing redness, pain, or swelling near your vaginal incision (episiotomy) or vaginal tear. You have pus draining from your episiotomy or vaginal tear. Your episiotomy or vaginal tear is separating. You have painful, hard, or reddened breasts. You have a severe headache. You have blurred vision or see spots. You feel sad or depressed. You have thoughts of hurting yourself or your newborn. You have questions about your care, the care of your newborn, or medicines. You are dizzy or lightheaded. You have a rash. You have nausea or vomiting. You were breastfeeding and have not had a menstrual period within 12 weeks after you stopped breastfeeding. You are not breastfeeding and have not had a menstrual period by the 12th week after delivery. You have a fever. SEEK IMMEDIATE MEDICAL CARE IF:  You have persistent pain. You have chest pain. You have shortness of breath. You faint. You have leg pain. You have stomach pain. Your vaginal bleeding saturates two or more sanitary pads in 1 hour. MAKE SURE YOU:  Understand these  instructions. Will watch your condition. Will get help right away if you are not doing well or get worse. Document Released: 03/26/2000  Document Revised: 12/22/2011 Document Reviewed: 11/24/2011 [facility] Patient Information 2014 [place], [place].

## 2022-07-20 NOTE — Lactation Note (Signed)
This note was copied from a baby's chart. Lactation Consultation Note  Patient Name: Yvonne Jenkins AESLP'N Date: 07/20/2022 Age:34 hours  P6, [redacted]w[redacted]d, 1% weight loss  Mother is formula feeding in the hospital and may breastfeed after she gets home. Per RN, mother states she does not need lactation services.   Consult Status  complete    Omar Person 07/20/2022, 11:22 AM

## 2022-07-21 ENCOUNTER — Telehealth: Payer: Self-pay | Admitting: Family Medicine

## 2022-07-21 NOTE — Telephone Encounter (Signed)
Spoke with patient about her appointments. She stated that was fine.

## 2022-07-21 NOTE — Telephone Encounter (Signed)
-----   Message from Myrtie Hawk, DO sent at 07/19/2022  6:03 PM EDT ----- Regarding: Postpartum Hello,   Please schedule this patient for a In person postpartum visit in 6 weeks with the following provider: Any provider. Additional Postpartum F/U:BP check 1 week  High risk pregnancy complicated by: HTN Delivery mode: Vaginal, Spontaneous  Anticipated Birth Control: POPs  Thank you,  Myrtie Hawk, DO [facility] Fellow

## 2022-07-22 ENCOUNTER — Encounter: Payer: Self-pay | Admitting: General Practice

## 2022-07-23 ENCOUNTER — Encounter: Payer: Medicaid Other | Admitting: Family Medicine

## 2022-07-27 ENCOUNTER — Other Ambulatory Visit: Payer: Self-pay

## 2022-07-27 ENCOUNTER — Ambulatory Visit (INDEPENDENT_AMBULATORY_CARE_PROVIDER_SITE_OTHER): Payer: Medicaid Other

## 2022-07-27 VITALS — BP 134/79 | HR 83 | Wt 199.0 lb

## 2022-07-27 DIAGNOSIS — Z0130 Encounter for examination of blood pressure without abnormal findings: Secondary | ICD-10-CM

## 2022-07-27 NOTE — Progress Notes (Signed)
Blood Pressure Check Visit  Rockell Faulks is here for blood pressure check following spontaneous vaginal birth on 07/19/22 with diagnosis of gest htn. BP normal prior to discharge. No BP med prescribed. BP today is 134/79. Reports dizziness 2 days prior that improved with food. Denies any HA, vision changes, or dizziness today. Will monitor for symptoms and contact office with any concerns. If no symptoms will return for PP visit 08/30/22.  Breastfeeding going well -- however, patient would like to increase milk supply so that she can build up stored breast milk for future use. Discussed feeding baby at the breast until satisfied and pumping following feeding. Pt reports not feeding or pumping for full 24 hours recently to rest; explained she will need to feed or pump regularly to increase supply. Recommended increased hydration, which patient is currently finding difficult.   Marjo Bicker, RN 07/27/2022  9:53 AM

## 2022-07-30 ENCOUNTER — Encounter: Payer: Self-pay | Admitting: Certified Nurse Midwife

## 2022-08-06 ENCOUNTER — Encounter: Payer: Self-pay | Admitting: Medical

## 2022-08-30 ENCOUNTER — Other Ambulatory Visit: Payer: Self-pay

## 2022-08-30 ENCOUNTER — Encounter: Payer: Self-pay | Admitting: Family Medicine

## 2022-08-30 ENCOUNTER — Ambulatory Visit (INDEPENDENT_AMBULATORY_CARE_PROVIDER_SITE_OTHER): Payer: Medicaid Other | Admitting: Family Medicine

## 2022-08-30 DIAGNOSIS — T385X5A Adverse effect of other estrogens and progestogens, initial encounter: Secondary | ICD-10-CM | POA: Diagnosis not present

## 2022-08-30 DIAGNOSIS — Z3009 Encounter for other general counseling and advice on contraception: Secondary | ICD-10-CM | POA: Diagnosis not present

## 2022-08-30 DIAGNOSIS — N921 Excessive and frequent menstruation with irregular cycle: Secondary | ICD-10-CM | POA: Diagnosis not present

## 2022-08-30 MED ORDER — IBUPROFEN 800 MG PO TABS
800.0000 mg | ORAL_TABLET | Freq: Three times a day (TID) | ORAL | 0 refills | Status: AC
Start: 2022-08-30 — End: 2022-09-06

## 2022-08-30 MED ORDER — NORGESTIMATE-ETH ESTRADIOL 0.25-35 MG-MCG PO TABS
1.0000 | ORAL_TABLET | Freq: Every day | ORAL | 11 refills | Status: AC
Start: 2022-10-10 — End: ?

## 2022-08-30 NOTE — Progress Notes (Signed)
Post Partum Visit Note  Yvonne Jenkins is a 32 y.o. [phone] female who presents for a postpartum visit. She is 6 weeks postpartum following a normal spontaneous vaginal delivery.  I have fully reviewed the prenatal and intrapartum course. The delivery was at [redacted]w[redacted]d gestational weeks.  Anesthesia: epidural. Postpartum course has been . Baby is doing well. Baby is feeding by bottle - Enfamil . Bleeding staining only. Bowel function is normal. Bladder function is normal. Patient is sexually active. Contraception method is Depo-Provera injections. Postpartum depression screening: negative. Bleeding on depo has been worse than her typical bleeding postpartum. She desires to stop depo and start OCPs. She is sexaully active now.    Upstream - 08/30/22 1100       Pregnancy Intention Screening   Does the patient want to become pregnant in the next year? Ok Either Way    Does the patient's partner want to become pregnant in the next year? Yes    Would the patient like to discuss contraceptive options today? No      Contraception Wrap Up   Current Method Hormonal Injection    Contraception Counseling Provided No            The pregnancy intention screening data noted above was reviewed. Potential methods of contraception were discussed. The patient elected to proceed with No data recorded.   Edinburgh Postnatal Depression Scale - 08/30/22 1059       Edinburgh Postnatal Depression Scale:  In the Past 7 Days   I have been able to laugh and see the funny side of things. 0    I have looked forward with enjoyment to things. 0    I have blamed myself unnecessarily when things went wrong. 0    I have been anxious or worried for no good reason. 0    I have felt scared or panicky for no good reason. 0    Things have been getting on top of me. 0    I have been so unhappy that I have had difficulty sleeping. 0    I have felt sad or miserable. 0    I have been so unhappy that I have been crying. 0     The thought of harming myself has occurred to me. 0    Edinburgh Postnatal Depression Scale Total 0             Health Maintenance Due  Topic Date Due   COVID-19 Vaccine (1) Never done    The following portions of the patient's history were reviewed and updated as appropriate: allergies, current medications, past family history, past medical history, past social history, past surgical history, and problem list.  Review of Systems Pertinent items are noted in HPI.  Objective:  BP (!) 124/92   Pulse 99   Wt 200 lb (90.7 kg)   LMP 10/25/2021   Breastfeeding No   BMI 30.86 kg/m    General:  alert, cooperative, and appears stated age   Breasts:  normal  Lungs: clear to auscultation bilaterally  Heart:  regular rate and rhythm, S1, S2 normal, no murmur, click, rub or gallop  Abdomen: soft, non-tender; bowel sounds normal; no masses,  no organomegaly   Wound N/a  GU exam:  normal       Assessment:   1. Routine postpartum follow-up - norgestimate-ethinyl estradiol (ORTHO-CYCLEN) 0.25-35 MG-MCG tablet; Take 1 tablet by mouth daily.  Dispense: 28 tablet; Refill: 11  2. Heavy vaginal bleeding due to  contraceptive injection use - ibuprofen (ADVIL) 800 MG tablet; Take 1 tablet (800 mg total) by mouth 3 (three) times daily for 7 days.  Dispense: 21 tablet; Refill: 0  3. Counseling for birth control, oral contraceptives - norgestimate-ethinyl estradiol (ORTHO-CYCLEN) 0.25-35 MG-MCG tablet; Take 1 tablet by mouth daily.  Dispense: 28 tablet; Refill: 11   Routine postpartum exam. Bleeding while on depo. Started ibuprofen x 7 days. Pt plans to stop her depo and start OCP. This was prescribed to start in late June/early July after depo function is complete. Will follow up in 3 months for AUB while on depo  Plan:   Essential components of care per ACOG recommendations:  1.  Mood and well being: Patient with negative depression screening today. Reviewed local resources for support.   - Patient tobacco use? No.   - hx of drug use? No.    2. Infant care and feeding:  -Patient currently breastmilk feeding? No.  -Social determinants of health (SDOH) reviewed in EPIC. No concerns  3. Sexuality, contraception and birth spacing - Patient does not want a pregnancy in the next year.  Desired family size is 8 children.  - Reviewed reproductive life planning. Reviewed contraceptive methods based on pt preferences and effectiveness.  Patient desired Hormonal Injection today.   - Discussed birth spacing of 18 months  4. Sleep and fatigue -Encouraged family/partner/community support of 4 hrs of uninterrupted sleep to help with mood and fatigue  5. Physical Recovery  - Discussed patients delivery and complications. She describes her labor as good. - Patient had a Vaginal, no problems at delivery. Patient no laceration. Perineal healing reviewed. Patient expressed understanding - Patient has urinary incontinence? No. - Patient is safe to resume physical and sexual activity  6.  Health Maintenance - HM due items addressed Yes - Last pap smear  Diagnosis  Date Value Ref Range Status  02/22/2022   Final   - Negative for Intraepithelial Lesions or Malignancy (NILM)  02/22/2022 - Benign reactive/reparative changes  Final   Pap smear not done at today's visit.  -Breast Cancer screening indicated? No.   7. Chronic Disease/Pregnancy Condition follow up: None  - PCP follow up  Myrtie Hawk, DO [facility], [facility]

## 2023-07-22 ENCOUNTER — Encounter: Admitting: Family Medicine

## 2023-08-01 ENCOUNTER — Encounter: Payer: Self-pay | Admitting: Family Medicine

## 2023-08-01 ENCOUNTER — Ambulatory Visit (INDEPENDENT_AMBULATORY_CARE_PROVIDER_SITE_OTHER): Admitting: Family Medicine

## 2023-08-01 VITALS — BP 138/84 | HR 93

## 2023-08-01 DIAGNOSIS — Z130 Encounter for screening for diseases of the blood and blood-forming organs and certain disorders involving the immune mechanism: Secondary | ICD-10-CM

## 2023-08-01 DIAGNOSIS — Z0000 Encounter for general adult medical examination without abnormal findings: Secondary | ICD-10-CM

## 2023-08-01 DIAGNOSIS — Z13220 Encounter for screening for lipoid disorders: Secondary | ICD-10-CM | POA: Diagnosis not present

## 2023-08-01 MED ORDER — PREDNISONE 20 MG PO TABS: 40.0000 mg | ORAL_TABLET | Freq: Every day | ORAL | 0 refills | Status: AC

## 2023-08-03 LAB — CBC WITH DIFFERENTIAL/PLATELET
Basophils Absolute: 0 10*3/uL (ref 0.0–0.2)
Basos: 0 %
EOS (ABSOLUTE): 0 10*3/uL (ref 0.0–0.4)
Eos: 0 %
Hematocrit: 36.5 % (ref 34.0–46.6)
Hemoglobin: 12.1 g/dL (ref 11.1–15.9)
Immature Grans (Abs): 0 10*3/uL (ref 0.0–0.1)
Immature Granulocytes: 0 %
Lymphocytes Absolute: 0.6 10*3/uL — ABNORMAL LOW (ref 0.7–3.1)
Lymphs: 6 %
MCH: 27.6 pg (ref 26.6–33.0)
MCHC: 33.2 g/dL (ref 31.5–35.7)
MCV: 83 fL (ref 79–97)
Monocytes Absolute: 0.6 10*3/uL (ref 0.1–0.9)
Monocytes: 6 %
Neutrophils Absolute: 8.8 10*3/uL — ABNORMAL HIGH (ref 1.4–7.0)
Neutrophils: 88 %
Platelets: 199 10*3/uL (ref 150–450)
RBC: 4.39 x10E6/uL (ref 3.77–5.28)
RDW: 12.9 % (ref 11.7–15.4)
WBC: 10.1 10*3/uL (ref 3.4–10.8)

## 2023-08-03 LAB — LIPID PANEL
Chol/HDL Ratio: 2.8 ratio (ref 0.0–4.4)
Cholesterol, Total: 180 mg/dL (ref 100–199)
HDL: 65 mg/dL (ref >39)
LDL Chol Calc (NIH): 106 mg/dL — ABNORMAL HIGH (ref 0–99)
Triglycerides: 46 mg/dL (ref 0–149)
VLDL Cholesterol Cal: 9 mg/dL (ref 5–40)

## 2023-08-03 LAB — CMP14+EGFR
ALT: 16 IU/L (ref 0–32)
AST: 19 IU/L (ref 0–40)
Albumin: 4.7 g/dL (ref 3.9–4.9)
Alkaline Phosphatase: 40 IU/L — ABNORMAL LOW (ref 44–121)
BUN/Creatinine Ratio: 12 (ref 9–23)
BUN: 10 mg/dL (ref 6–20)
Bilirubin Total: <0.2 mg/dL (ref 0.0–1.2)
CO2: 20 mmol/L (ref 20–29)
Calcium: 9.4 mg/dL (ref 8.7–10.2)
Chloride: 105 mmol/L (ref 96–106)
Creatinine, Ser: 0.83 mg/dL (ref 0.57–1.00)
Globulin, Total: 2.3 g/dL (ref 1.5–4.5)
Glucose: 119 mg/dL — ABNORMAL HIGH (ref 70–99)
Potassium: 3.8 mmol/L (ref 3.5–5.2)
Sodium: 143 mmol/L (ref 134–144)
Total Protein: 7 g/dL (ref 6.0–8.5)
eGFR: 95 mL/min/{1.73_m2} (ref >59)

## 2023-08-04 ENCOUNTER — Encounter: Payer: Self-pay | Admitting: Family Medicine

## 2023-08-04 NOTE — Progress Notes (Signed)
 Established Patient Office Visit  Subjective    Patient ID: Yvonne Jenkins, female    DOB: [birth year]  Age: 33 y.o. MRN: [id number]  CC:  Chief Complaint  Patient presents with   Annual Exam    HPI Yvonne Jenkins presents for routine annual exam. Patient denies acute complaints.   Outpatient Encounter Medications as of 08/01/2023  Medication Sig   albuterol  (VENTOLIN  HFA) 108 (90 Base) MCG/ACT inhaler Inhale 2 puffs into the lungs every 2 (two) hours as needed for wheezing or shortness of breath (cough). (Patient not taking: Reported on 07/16/2022)   Blood Pressure Monitoring DEVI 1 each by Does not apply route once a week. (Patient not taking: Reported on 08/30/2022)   erythromycin  ophthalmic ointment Place a 1/2 inch ribbon of ointment into the lower eyelid 4x daily for 7 days (Patient not taking: Reported on 07/16/2022)   fluticasone  (FLOVENT  HFA) 44 MCG/ACT inhaler Inhale 2 puffs into the lungs 2 (two) times daily. (Patient not taking: Reported on 07/16/2022)   norgestimate -ethinyl estradiol  (ORTHO-CYCLEN) 0.25-35 MG-MCG tablet Take 1 tablet by mouth daily.   predniSONE  (DELTASONE ) 20 MG tablet Take 2 tablets (40 mg total) by mouth daily.   Prenatal Vit-Fe Fumarate-FA (MULTIVITAMIN-PRENATAL) 27-0.8 MG TABS tablet Take 1 tablet by mouth daily at 12 noon.   valACYclovir  (VALTREX ) 500 MG tablet Take 1 tablet (500 mg total) by mouth 2 (two) times daily. (Patient not taking: Reported on 07/27/2022)   [DISCONTINUED] predniSONE  (DELTASONE ) 20 MG tablet Take 2 tablets (40 mg total) by mouth daily. (Patient not taking: Reported on 05/13/2022)   No facility-administered encounter medications on file as of 08/01/2023.    Past Medical History:  Diagnosis Date   Asthma    last used inhaler 39mo ago   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 06/24/2021   Retained products of conception after miscarriage 06/26/2021   Subchorionic hemorrhage in first trimester 06/24/2021    Past  Surgical History:  Procedure Laterality Date   DILATION AND EVACUATION N/A 06/26/2021   Procedure: DILATATION AND EVACUATION;  Surgeon: Lacey Pian, MD;  Location: [facility] OR;  Service: Gynecology;  Laterality: N/A;    Family History  Problem Relation Age of Onset   Diabetes Mother    Hypertension Neg Hx     Social History   Socioeconomic History   Marital status: Single    Spouse name: Not on file   Number of children: Not on file   Years of education: Not on file   Highest education level: Not on file  Occupational History   Not on file  Tobacco Use   Smoking status: Former    Current packs/day: 0.25    Types: Cigarettes    Passive exposure: Current   Smokeless tobacco: Never  Vaping Use   Vaping status: Never Used  Substance and Sexual Activity   Alcohol use: No   Drug use: No   Sexual activity: Yes    Birth control/protection: Pill  Other Topics Concern   Not on file  Social History Narrative   Not on file   Social Drivers of Health   Financial Resource Strain: Not on file  Food Insecurity: No Food Insecurity (07/20/2022)   Hunger Vital Sign    Worried About Running Out of Food in the Last Year: Never true    Ran Out of Food in the Last Year: Never true  Transportation Needs: No Transportation Needs (07/20/2022)   PRAPARE - Administrator, Civil Service (Medical): No  Lack of Transportation (Non-Medical): No  Physical Activity: Not on file  Stress: Not on file  Social Connections: Not on file  Intimate Partner Violence: Not At Risk (07/20/2022)   Humiliation, Afraid, Rape, and Kick questionnaire    Fear of Current or Ex-Partner: No    Emotionally Abused: No    Physically Abused: No    Sexually Abused: No    Review of Systems  All other systems reviewed and are negative.       Objective    BP 138/84 (BP Location: Right Arm, Patient Position: Sitting, Cuff Size: Normal)   Pulse 93   SpO2 94%   Physical Exam Vitals and nursing note  reviewed.  Constitutional:      General: She is not in acute distress. Cardiovascular:     Rate and Rhythm: Normal rate and regular rhythm.  Pulmonary:     Effort: Pulmonary effort is normal.     Breath sounds: Normal breath sounds.  Abdominal:     Palpations: Abdomen is soft.     Tenderness: There is no abdominal tenderness.  Neurological:     General: No focal deficit present.     Mental Status: She is alert and oriented to person, place, and time.         Assessment & Plan:   Annual physical exam -     CMP14+EGFR  Screening for deficiency anemia -     CBC with Differential/Platelet  Screening for lipid disorders -     Lipid panel  Other orders -     predniSONE ; Take 2 tablets (40 mg total) by mouth daily.  Dispense: 10 tablet; Refill: 0     Return if symptoms worsen or fail to improve.   Arlo Lama, MD

## 2023-08-17 ENCOUNTER — Other Ambulatory Visit: Payer: Self-pay | Admitting: Family Medicine

## 2023-08-17 MED ORDER — METRONIDAZOLE 500 MG PO TABS: 500.0000 mg | ORAL_TABLET | Freq: Two times a day (BID) | ORAL | 0 refills | Status: AC

## 2023-08-31 ENCOUNTER — Other Ambulatory Visit: Payer: Self-pay | Admitting: Family Medicine

## 2024-01-16 ENCOUNTER — Ambulatory Visit: Admitting: Family Medicine

## 2024-01-16 ENCOUNTER — Encounter: Payer: Self-pay | Admitting: Family Medicine

## 2024-01-16 VITALS — BP 132/84 | HR 87 | Temp 97.4°F | Resp 16 | Ht 67.5 in | Wt 198.8 lb

## 2024-01-16 DIAGNOSIS — Z23 Encounter for immunization: Secondary | ICD-10-CM | POA: Diagnosis not present

## 2024-01-16 DIAGNOSIS — Z3202 Encounter for pregnancy test, result negative: Secondary | ICD-10-CM | POA: Diagnosis not present

## 2024-01-16 DIAGNOSIS — N760 Acute vaginitis: Secondary | ICD-10-CM

## 2024-01-16 LAB — POCT URINE PREGNANCY: Preg Test, Ur: NEGATIVE

## 2024-01-16 MED ORDER — METRONIDAZOLE 500 MG PO TABS: 500.0000 mg | ORAL_TABLET | Freq: Two times a day (BID) | ORAL | 0 refills | Status: AC

## 2024-01-16 NOTE — Progress Notes (Signed)
 Established Patient Office Visit  Subjective    Patient ID: Yvonne Jenkins, female    DOB: December 10, 1990  Age: 33 y.o. MRN: [id number]  CC: No chief complaint on file.   HPI Jeslyn Amsler presents for recurrent vaginitis symptoms. She reports that her sexual partner has not been tested.   Outpatient Encounter Medications as of 01/16/2024  Medication Sig   metroNIDAZOLE  (FLAGYL ) 500 MG tablet Take 1 tablet (500 mg total) by mouth 2 (two) times daily for 7 days.   albuterol  (VENTOLIN  HFA) 108 (90 Base) MCG/ACT inhaler Inhale 2 puffs into the lungs every 2 (two) hours as needed for wheezing or shortness of breath (cough). (Patient not taking: Reported on 07/16/2022)   Blood Pressure Monitoring DEVI 1 each by Does not apply route once a week. (Patient not taking: Reported on 08/30/2022)   erythromycin  ophthalmic ointment Place a 1/2 inch ribbon of ointment into the lower eyelid 4x daily for 7 days (Patient not taking: Reported on 07/16/2022)   fluticasone  (FLOVENT  HFA) 44 MCG/ACT inhaler Inhale 2 puffs into the lungs 2 (two) times daily. (Patient not taking: Reported on 07/16/2022)   norgestimate -ethinyl estradiol  (ORTHO-CYCLEN) 0.25-35 MG-MCG tablet Take 1 tablet by mouth daily. (Patient not taking: Reported on 01/16/2024)   predniSONE  (DELTASONE ) 20 MG tablet Take 2 tablets (40 mg total) by mouth daily. (Patient not taking: Reported on 01/16/2024)   Prenatal Vit-Fe Fumarate-FA (MULTIVITAMIN-PRENATAL) 27-0.8 MG TABS tablet Take 1 tablet by mouth daily at 12 noon. (Patient not taking: Reported on 01/16/2024)   valACYclovir  (VALTREX ) 500 MG tablet Take 1 tablet (500 mg total) by mouth 2 (two) times daily. (Patient not taking: Reported on 07/27/2022)   No facility-administered encounter medications on file as of 01/16/2024.    Past Medical History:  Diagnosis Date   Asthma    last used inhaler 32mo ago   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 06/24/2021   Retained products of conception  after miscarriage 06/26/2021   Subchorionic hemorrhage in first trimester 06/24/2021    Past Surgical History:  Procedure Laterality Date   DILATION AND EVACUATION N/A 06/26/2021   Procedure: DILATATION AND EVACUATION;  Surgeon: Cleatus Moccasin, MD;  Location: [facility] OR;  Service: Gynecology;  Laterality: N/A;    Family History  Problem Relation Age of Onset   Diabetes Mother    Hypertension Neg Hx     Social History   Socioeconomic History   Marital status: Single    Spouse name: Not on file   Number of children: Not on file   Years of education: Not on file   Highest education level: Not on file  Occupational History   Not on file  Tobacco Use   Smoking status: Former    Current packs/day: 0.25    Types: Cigarettes    Passive exposure: Current   Smokeless tobacco: Never  Vaping Use   Vaping status: Never Used  Substance and Sexual Activity   Alcohol use: No   Drug use: No   Sexual activity: Yes    Birth control/protection: Pill  Other Topics Concern   Not on file  Social History Narrative   Not on file   Social Drivers of Health   Financial Resource Strain: Low Risk  (01/16/2024)   Overall Financial Resource Strain (CARDIA)    Difficulty of Paying Living Expenses: Not hard at all  Food Insecurity: No Food Insecurity (01/16/2024)   Hunger Vital Sign    Worried About Running Out of Food in the Last  Year: Never true    Ran Out of Food in the Last Year: Never true  Transportation Needs: No Transportation Needs (01/16/2024)   PRAPARE - Administrator, Civil Service (Medical): No    Lack of Transportation (Non-Medical): No  Physical Activity: Sufficiently Active (01/16/2024)   Exercise Vital Sign    Days of Exercise per Week: 7 days    Minutes of Exercise per Session: 30 min  Stress: No Stress Concern Present (01/16/2024)   [employer] of Occupational Health - Occupational Stress Questionnaire    Feeling of Stress: Not at all  Social Connections:  Moderately Isolated (01/16/2024)   Social Connection and Isolation Panel    Frequency of Communication with Friends and Family: More than three times a week    Frequency of Social Gatherings with Friends and Family: More than three times a week    Attends Religious Services: More than 4 times per year    Active Member of [employer] or Organizations: No    Attends Banker Meetings: Never    Marital Status: Never married  Intimate Partner Violence: Not At Risk (01/16/2024)   Humiliation, Afraid, Rape, and Kick questionnaire    Fear of Current or Ex-Partner: No    Emotionally Abused: No    Physically Abused: No    Sexually Abused: No    Review of Systems  All other systems reviewed and are negative.       Objective    BP 132/84   Pulse 87   Temp (!) 97.4 F (36.3 C) (Oral)   Resp 16   Ht 5' 7.5 (1.715 m)   Wt 198 lb 12.8 oz (90.2 kg)   LMP 01/02/2024 (Approximate)   SpO2 98%   Breastfeeding No   BMI 30.68 kg/m   Physical Exam Vitals and nursing note reviewed.  Constitutional:      General: She is not in acute distress. Cardiovascular:     Rate and Rhythm: Normal rate and regular rhythm.  Pulmonary:     Effort: Pulmonary effort is normal.     Breath sounds: Normal breath sounds.  Abdominal:     Palpations: Abdomen is soft.     Tenderness: There is no abdominal tenderness.  Neurological:     General: No focal deficit present.     Mental Status: She is alert and oriented to person, place, and time.         Assessment & Plan:   Vaginitis and vulvovaginitis  Immunization due -     HPV 9-valent vaccine,Recombinat  Negative pregnancy test -     POCT urine pregnancy  Other orders -     metroNIDAZOLE ; Take 1 tablet (500 mg total) by mouth 2 (two) times daily for 7 days.  Dispense: 14 tablet; Refill: 0   Patient deferred self swab as these were recurrent symptoms.   No follow-ups on file.   Tanda Raguel SQUIBB, MD

## 2024-02-02 ENCOUNTER — Ambulatory Visit: Admitting: Family Medicine

## 2024-02-28 ENCOUNTER — Telehealth: Payer: Self-pay | Admitting: Family Medicine

## 2024-02-28 NOTE — Telephone Encounter (Signed)
 Copied from CRM [id number]. Topic: General - Other >> Feb 28, 2024 11:46 AM Jasmin G wrote: Reason for CRM: Pt requested a call back at [phone] to discuss the need for her children's immunization records.

## 2024-03-19 ENCOUNTER — Encounter: Payer: Self-pay | Admitting: Family Medicine

## 2024-03-19 ENCOUNTER — Ambulatory Visit: Admitting: Family Medicine

## 2024-03-19 MED ORDER — ALBUTEROL SULFATE HFA 108 (90 BASE) MCG/ACT IN AERS: 2.0000 | INHALATION_SPRAY | RESPIRATORY_TRACT | 0 refills | Status: AC | PRN

## 2024-03-21 ENCOUNTER — Encounter: Payer: Self-pay | Admitting: Family Medicine

## 2024-03-21 NOTE — Progress Notes (Signed)
 Established Patient Office Visit  Subjective    Patient ID: Yvonne Jenkins, female    DOB: 06/28/1990  Age: 33 y.o. MRN: [id number]  CC:  Chief Complaint  Patient presents with   Medical Management of Chronic Issues    Pt request new inhaler     HPI [name] presents for follow up of asthma and refills of inhaler. She denies acute complaints.   Outpatient Encounter Medications as of 03/19/2024  Medication Sig   albuterol  (VENTOLIN  HFA) 108 (90 Base) MCG/ACT inhaler Inhale 2 puffs into the lungs every 2 (two) hours as needed for wheezing or shortness of breath (cough).   Blood Pressure Monitoring DEVI 1 each by Does not apply route once a week. (Patient not taking: Reported on 08/30/2022)   erythromycin  ophthalmic ointment Place a 1/2 inch ribbon of ointment into the lower eyelid 4x daily for 7 days (Patient not taking: Reported on 07/16/2022)   fluticasone  (FLOVENT  HFA) 44 MCG/ACT inhaler Inhale 2 puffs into the lungs 2 (two) times daily. (Patient not taking: Reported on 07/16/2022)   norgestimate -ethinyl estradiol  (ORTHO-CYCLEN) 0.25-35 MG-MCG tablet Take 1 tablet by mouth daily. (Patient not taking: Reported on 01/16/2024)   predniSONE  (DELTASONE ) 20 MG tablet Take 2 tablets (40 mg total) by mouth daily. (Patient not taking: Reported on 01/16/2024)   Prenatal Vit-Fe Fumarate-FA (MULTIVITAMIN-PRENATAL) 27-0.8 MG TABS tablet Take 1 tablet by mouth daily at 12 noon. (Patient not taking: Reported on 01/16/2024)   valACYclovir  (VALTREX ) 500 MG tablet Take 1 tablet (500 mg total) by mouth 2 (two) times daily. (Patient not taking: Reported on 07/27/2022)   [DISCONTINUED] albuterol  (VENTOLIN  HFA) 108 (90 Base) MCG/ACT inhaler Inhale 2 puffs into the lungs every 2 (two) hours as needed for wheezing or shortness of breath (cough). (Patient not taking: Reported on 07/16/2022)   No facility-administered encounter medications on file as of 03/19/2024.    Past Medical History:  Diagnosis Date   Asthma     last used inhaler 63mo ago   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 06/24/2021   Retained products of conception after miscarriage 06/26/2021   Subchorionic hemorrhage in first trimester 06/24/2021    Past Surgical History:  Procedure Laterality Date   DILATION AND EVACUATION N/A 06/26/2021   Procedure: DILATATION AND EVACUATION;  Surgeon: Cleatus Moccasin, MD;  Location: [facility] OR;  Service: Gynecology;  Laterality: N/A;    Family History  Problem Relation Age of Onset   Diabetes Mother    Hypertension Neg Hx     Social History   Socioeconomic History   Marital status: Single    Spouse name: Not on file   Number of children: Not on file   Years of education: Not on file   Highest education level: Not on file  Occupational History   Not on file  Tobacco Use   Smoking status: Former    Current packs/day: 0.25    Types: Cigarettes    Passive exposure: Current   Smokeless tobacco: Never  Vaping Use   Vaping status: Never Used  Substance and Sexual Activity   Alcohol use: No   Drug use: No   Sexual activity: Yes    Birth control/protection: Pill  Other Topics Concern   Not on file  Social History Narrative   Not on file   Social Drivers of Health   Financial Resource Strain: Low Risk  (01/16/2024)   Overall Financial Resource Strain (CARDIA)    Difficulty of Paying Living Expenses: Not hard at  all  Food Insecurity: No Food Insecurity (01/16/2024)   Hunger Vital Sign    Worried About Running Out of Food in the Last Year: Never true    Ran Out of Food in the Last Year: Never true  Transportation Needs: No Transportation Needs (01/16/2024)   PRAPARE - Administrator, Civil Service (Medical): No    Lack of Transportation (Non-Medical): No  Physical Activity: Sufficiently Active (01/16/2024)   Exercise Vital Sign    Days of Exercise per Week: 7 days    Minutes of Exercise per Session: 30 min  Stress: No Stress Concern Present (01/16/2024)    [employer] of Occupational Health - Occupational Stress Questionnaire    Feeling of Stress: Not at all  Social Connections: Moderately Isolated (01/16/2024)   Social Connection and Isolation Panel    Frequency of Communication with Friends and Family: More than three times a week    Frequency of Social Gatherings with Friends and Family: More than three times a week    Attends Religious Services: More than 4 times per year    Active Member of [employer] or Organizations: No    Attends Banker Meetings: Never    Marital Status: Never married  Intimate Partner Violence: Not At Risk (01/16/2024)   Humiliation, Afraid, Rape, and Kick questionnaire    Fear of Current or Ex-Partner: No    Emotionally Abused: No    Physically Abused: No    Sexually Abused: No    Review of Systems  All other systems reviewed and are negative.       Objective    BP 123/86   Pulse 91   Ht 5' 7.5 (1.715 m)   Wt 205 lb 9.6 oz (93.3 kg)   LMP 03/19/2024 (Approximate)   SpO2 96%   BMI 31.73 kg/m   Physical Exam Vitals and nursing note reviewed.  Constitutional:      General: She is not in acute distress. Cardiovascular:     Rate and Rhythm: Normal rate and regular rhythm.  Pulmonary:     Effort: Pulmonary effort is normal.     Breath sounds: Normal breath sounds.  Neurological:     General: No focal deficit present.     Mental Status: She is alert and oriented to person, place, and time.         Assessment & Plan:   Mild intermittent asthma without complication  Other orders -     Albuterol  Sulfate HFA; Inhale 2 puffs into the lungs every 2 (two) hours as needed for wheezing or shortness of breath (cough).  Dispense: 1 each; Refill: 0     No follow-ups on file.   Tanda Raguel SQUIBB, MD

## 2024-05-21 ENCOUNTER — Ambulatory Visit: Admitting: Family Medicine

## 2024-07-31 ENCOUNTER — Encounter: Admitting: Family Medicine
